# Patient Record
Sex: Female | Born: 1945 | Race: White | Hispanic: No | Marital: Married | Smoking: Never smoker
Health system: Southern US, Community
[De-identification: ages and names within clinical notes are randomized; demographics above are authoritative.]

## PROBLEM LIST (undated history)

## (undated) DIAGNOSIS — R112 Nausea with vomiting, unspecified: Secondary | ICD-10-CM

## (undated) DIAGNOSIS — E119 Type 2 diabetes mellitus without complications: Secondary | ICD-10-CM

## (undated) DIAGNOSIS — T8859XA Other complications of anesthesia, initial encounter: Secondary | ICD-10-CM

## (undated) DIAGNOSIS — L509 Urticaria, unspecified: Secondary | ICD-10-CM

## (undated) DIAGNOSIS — Z9889 Other specified postprocedural states: Secondary | ICD-10-CM

## (undated) DIAGNOSIS — I10 Essential (primary) hypertension: Secondary | ICD-10-CM

## (undated) DIAGNOSIS — T4145XA Adverse effect of unspecified anesthetic, initial encounter: Secondary | ICD-10-CM

## (undated) DIAGNOSIS — K8020 Calculus of gallbladder without cholecystitis without obstruction: Secondary | ICD-10-CM

## (undated) DIAGNOSIS — K219 Gastro-esophageal reflux disease without esophagitis: Secondary | ICD-10-CM

## (undated) HISTORY — PX: OTHER SURGICAL HISTORY: SHX169

## (undated) HISTORY — DX: Urticaria, unspecified: L50.9

## (undated) HISTORY — PX: ESOPHAGOGASTRODUODENOSCOPY ENDOSCOPY: SHX5814

## (undated) HISTORY — PX: ABDOMINAL HYSTERECTOMY: SHX81

---

## 2004-03-29 ENCOUNTER — Encounter: Admission: RE | Admit: 2004-03-29 | Discharge: 2004-03-29 | Payer: Self-pay | Admitting: Family Medicine

## 2004-04-24 ENCOUNTER — Ambulatory Visit (HOSPITAL_COMMUNITY): Admission: RE | Admit: 2004-04-24 | Discharge: 2004-04-24 | Payer: Self-pay | Admitting: Gastroenterology

## 2005-04-12 ENCOUNTER — Encounter: Admission: RE | Admit: 2005-04-12 | Discharge: 2005-04-12 | Payer: Self-pay | Admitting: Family Medicine

## 2006-04-24 ENCOUNTER — Other Ambulatory Visit: Admission: RE | Admit: 2006-04-24 | Discharge: 2006-04-24 | Payer: Self-pay | Admitting: Family Medicine

## 2006-05-08 ENCOUNTER — Encounter: Admission: RE | Admit: 2006-05-08 | Discharge: 2006-05-08 | Payer: Self-pay | Admitting: Family Medicine

## 2007-05-13 ENCOUNTER — Encounter: Admission: RE | Admit: 2007-05-13 | Discharge: 2007-05-13 | Payer: Self-pay | Admitting: Family Medicine

## 2008-05-13 ENCOUNTER — Encounter: Admission: RE | Admit: 2008-05-13 | Discharge: 2008-05-13 | Payer: Self-pay | Admitting: Family Medicine

## 2009-05-15 ENCOUNTER — Encounter: Admission: RE | Admit: 2009-05-15 | Discharge: 2009-05-15 | Payer: Self-pay | Admitting: Family Medicine

## 2010-04-21 ENCOUNTER — Other Ambulatory Visit: Payer: Self-pay | Admitting: Family Medicine

## 2010-04-21 DIAGNOSIS — Z1239 Encounter for other screening for malignant neoplasm of breast: Secondary | ICD-10-CM

## 2010-05-16 ENCOUNTER — Ambulatory Visit
Admission: RE | Admit: 2010-05-16 | Discharge: 2010-05-16 | Disposition: A | Discharge status: Home / Self Care | Payer: 59 | Source: Ambulatory Visit | Attending: Family Medicine | Admitting: Family Medicine

## 2010-05-16 DIAGNOSIS — Z1239 Encounter for other screening for malignant neoplasm of breast: Secondary | ICD-10-CM

## 2010-08-17 NOTE — Op Note (Signed)
NAME:  Margaret, Bond NO.:  [id number]   MEDICAL RECORD NO.:  [id number]          PATIENT TYPE:  AMB   LOCATION:  ENDO                         FACILITY:  [facility]   PHYSICIAN:  Graylin Shiver, M.D.   DATE OF BIRTH:  [birth year]   DATE OF PROCEDURE:  04/24/2004  DATE OF DISCHARGE:                                 OPERATIVE REPORT   INDICATIONS FOR PROCEDURE:  Family history of colon cancer.   Informed consent was obtained after explanation of the risks of bleeding,  infection and perforation.   PREMEDICATION:  Fentanyl 80 mcg IV, Versed 8 mg IV.   PROCEDURE:  With the patient in the left lateral decubitus position, a  rectal exam was performed. No masses were felt. The Olympus colonoscope was  inserted into the rectum and advanced around the colon to the cecum. Cecal  landmarks were identified. The cecum and ascending colon were normal. The  transverse colon normal. The descending colon, sigmoid and rectum were  normal. She tolerated the procedure well without complications.   IMPRESSION:  Normal colonoscopy to the cecum.   I would recommend a follow-up colonoscopy again in five years.      SFG/MEDQ  D:  04/24/2004  T:  04/24/2004  Job:  [id number]   cc:   Santiago Glad, P.A.  [place]

## 2011-04-29 ENCOUNTER — Other Ambulatory Visit: Payer: Self-pay | Admitting: Family Medicine

## 2011-04-29 DIAGNOSIS — Z1231 Encounter for screening mammogram for malignant neoplasm of breast: Secondary | ICD-10-CM

## 2011-05-21 ENCOUNTER — Ambulatory Visit
Admission: RE | Admit: 2011-05-21 | Discharge: 2011-05-21 | Disposition: A | Discharge status: Home / Self Care | Payer: 59 | Source: Ambulatory Visit | Attending: Family Medicine | Admitting: Family Medicine

## 2011-05-21 DIAGNOSIS — Z1231 Encounter for screening mammogram for malignant neoplasm of breast: Secondary | ICD-10-CM

## 2011-05-27 ENCOUNTER — Other Ambulatory Visit: Payer: Self-pay | Admitting: Family Medicine

## 2011-05-27 DIAGNOSIS — R928 Other abnormal and inconclusive findings on diagnostic imaging of breast: Secondary | ICD-10-CM

## 2011-05-29 ENCOUNTER — Ambulatory Visit
Admission: RE | Admit: 2011-05-29 | Discharge: 2011-05-29 | Disposition: A | Discharge status: Home / Self Care | Payer: 59 | Source: Ambulatory Visit | Attending: Family Medicine | Admitting: Family Medicine

## 2011-05-29 DIAGNOSIS — R928 Other abnormal and inconclusive findings on diagnostic imaging of breast: Secondary | ICD-10-CM

## 2012-04-21 ENCOUNTER — Other Ambulatory Visit: Payer: Self-pay | Admitting: Family Medicine

## 2012-04-21 DIAGNOSIS — Z1231 Encounter for screening mammogram for malignant neoplasm of breast: Secondary | ICD-10-CM

## 2012-06-12 ENCOUNTER — Ambulatory Visit
Admission: RE | Admit: 2012-06-12 | Discharge: 2012-06-12 | Disposition: A | Discharge status: Home / Self Care | Payer: Medicare Other | Source: Ambulatory Visit | Attending: Family Medicine | Admitting: Family Medicine

## 2012-06-12 DIAGNOSIS — Z1231 Encounter for screening mammogram for malignant neoplasm of breast: Secondary | ICD-10-CM

## 2012-12-01 IMAGING — US US BREAST RIGHT
1 series · 9 of 9 positions shown · non-contrast
Comparison: Prior studies

CLINICAL DATA: Recall from screening mammography.

DIGITAL DIAGNOSTIC RIGHT BREAST MAMMOGRAM  AND RIGHT BREAST
ULTRASOUND:

[Series 1: us breast right · 9 of 9 slices shown]
[im 1/9]
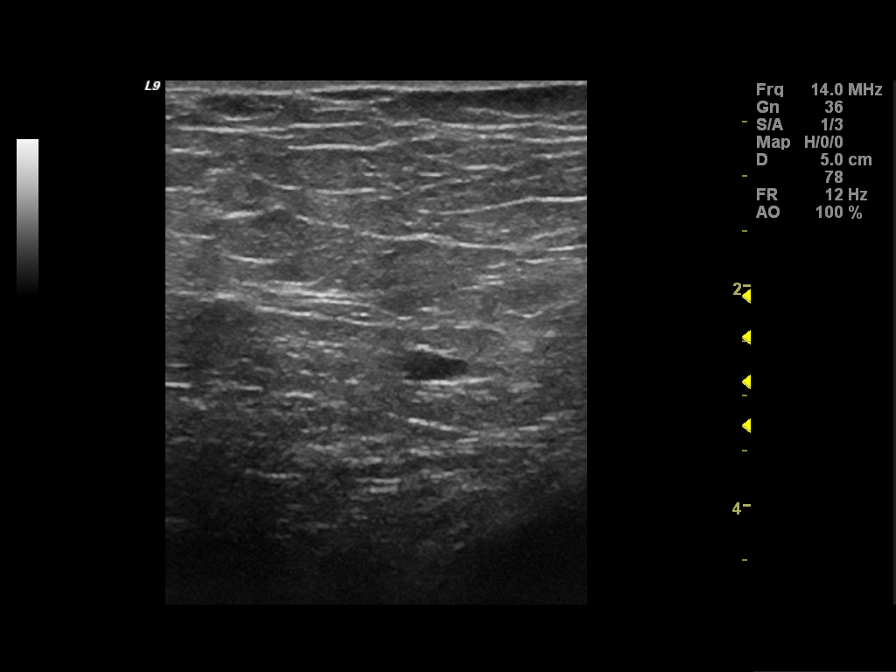
[im 2/9]
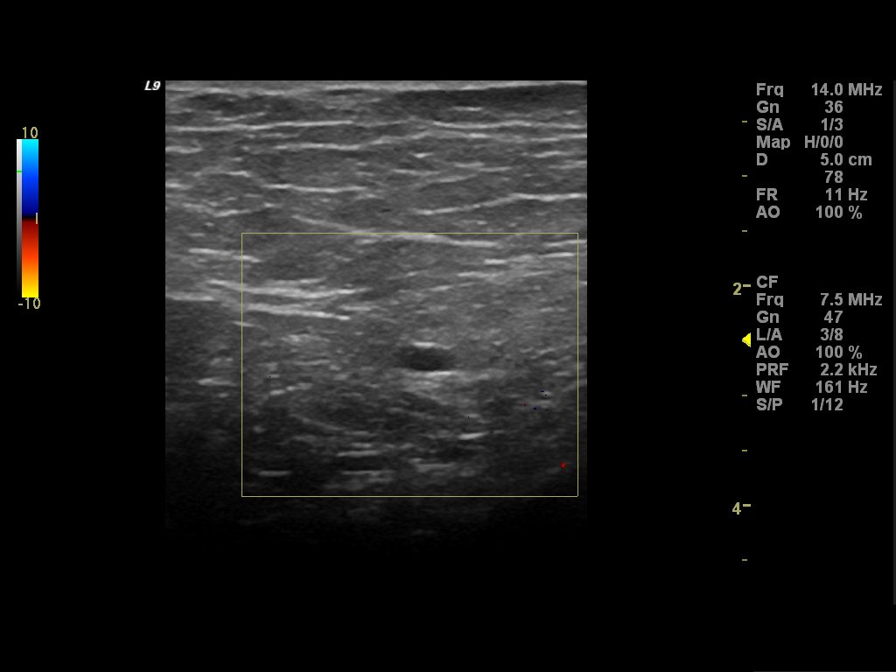
[im 3/9]
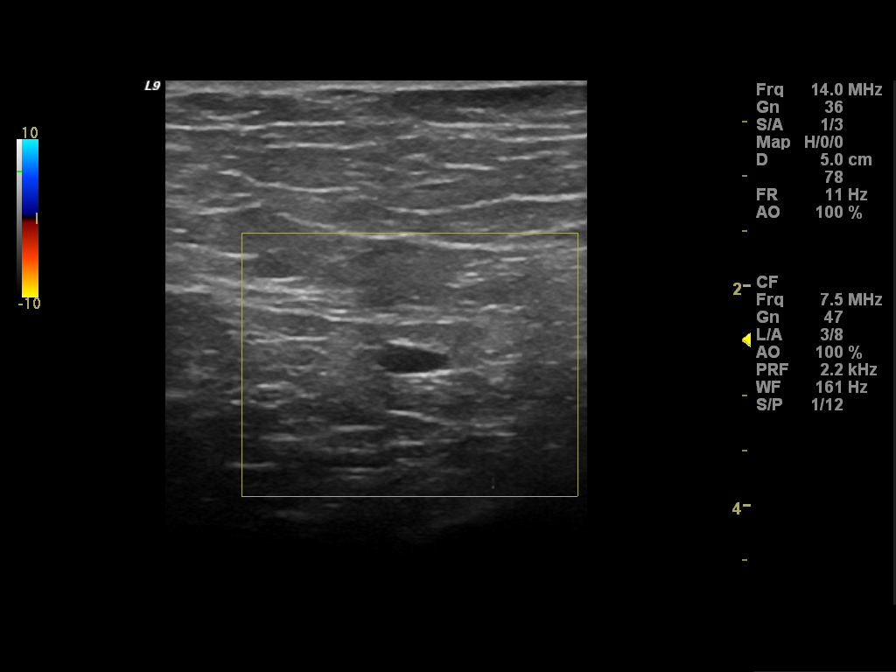
[im 4/9]
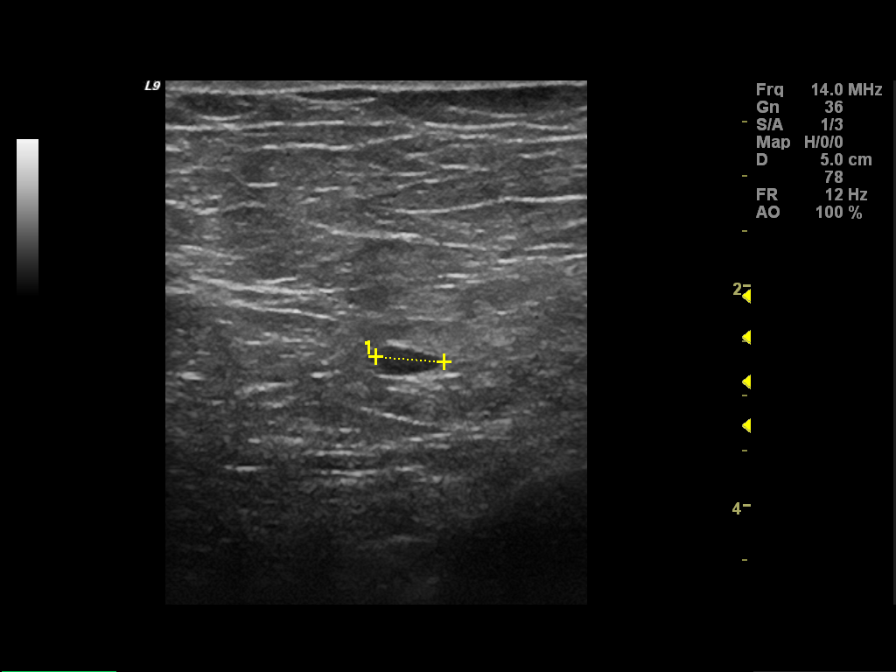
[im 5/9]
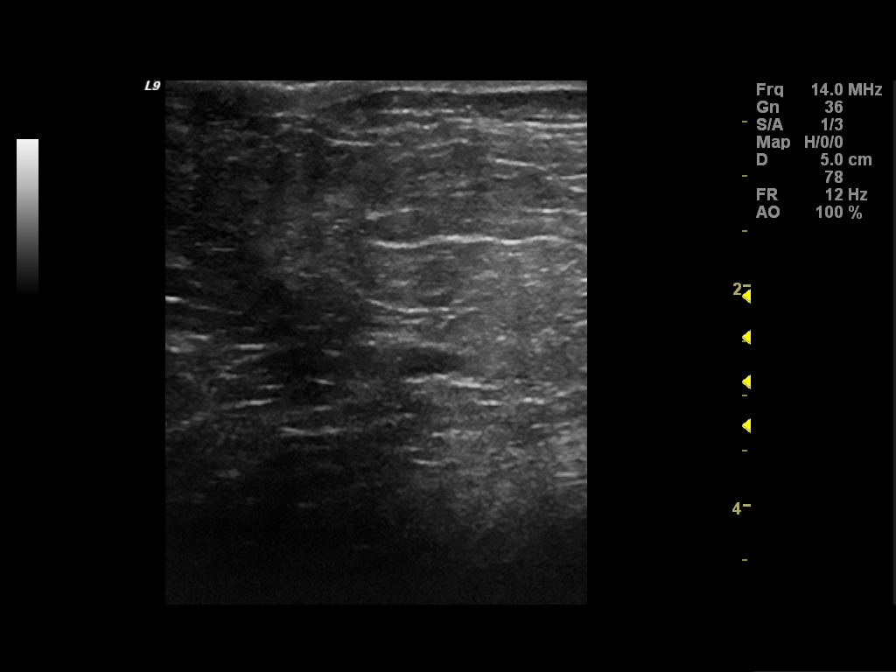
[im 6/9]
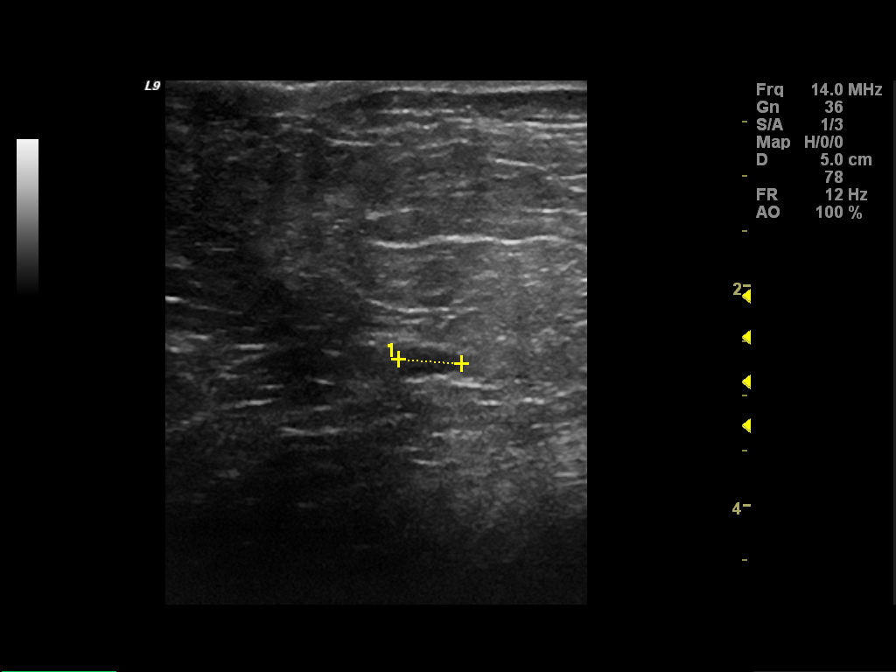
[im 7/9]
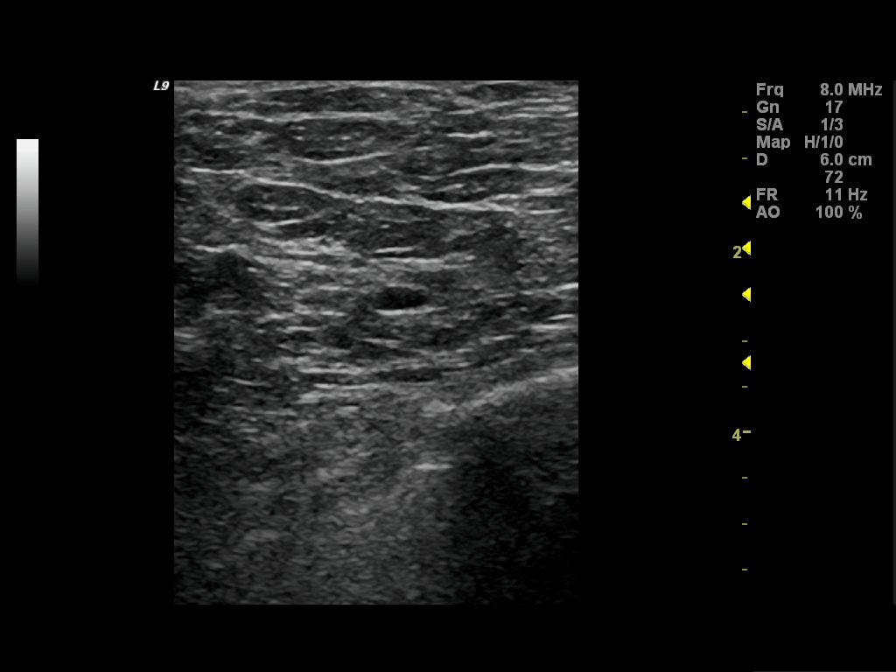
[im 8/9]
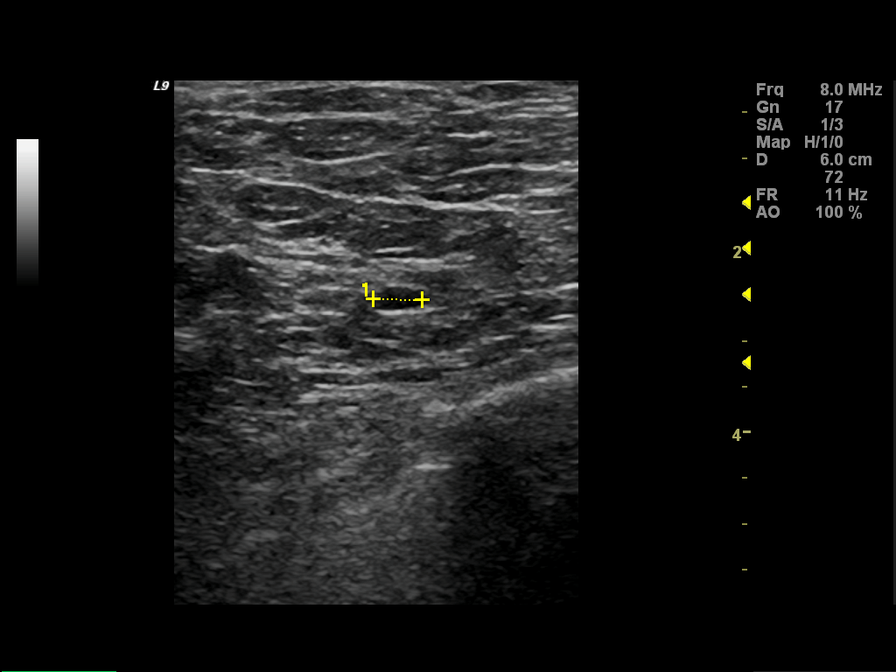
[im 9/9]
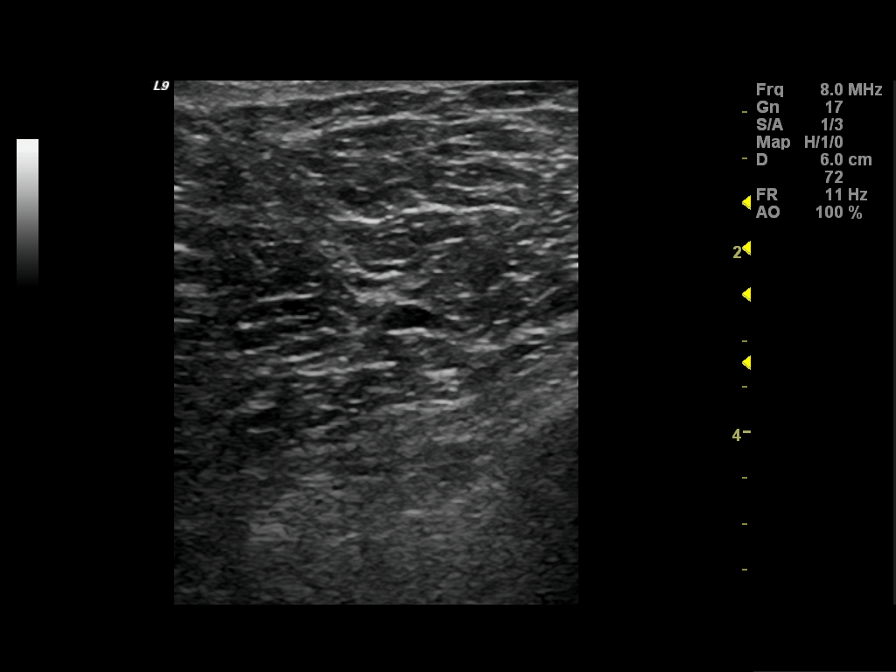

[9 of 9 positions shown; findings below may reference images not displayed]

FINDINGS: Spot compression views of the right breast demonstrate
an oval, circumscribed mass to be located within the inferior
portion of the right breast at the 6 o'clock position.

On physical exam, there is no discrete palpable abnormality within
the right breast.

Ultrasound is performed, showing a simple cyst located within the
right breast at the 6 o'clock position 1 cm from the nipple
measuring 6 mm in size.  This corresponds to the mammographic
finding.
IMPRESSION: 6 mm simple cyst located within the right breast at the 6 o'clock
position 1 cm from the nipple corresponding to the mammographic
finding.  No findings worrisome for malignancy.  Recommend
screening mammography in 1 year.

BI-RADS CATEGORY 2:  Benign finding(s).

## 2013-05-11 ENCOUNTER — Other Ambulatory Visit: Payer: Self-pay

## 2013-05-11 DIAGNOSIS — Z1231 Encounter for screening mammogram for malignant neoplasm of breast: Secondary | ICD-10-CM

## 2013-06-15 ENCOUNTER — Ambulatory Visit
Admission: RE | Admit: 2013-06-15 | Discharge: 2013-06-15 | Disposition: A | Discharge status: Home / Self Care | Payer: Medicare Other | Source: Ambulatory Visit

## 2013-06-15 DIAGNOSIS — Z1231 Encounter for screening mammogram for malignant neoplasm of breast: Secondary | ICD-10-CM

## 2013-11-16 ENCOUNTER — Ambulatory Visit: Payer: Medicare Other | Admitting: Physical Therapy

## 2014-05-17 ENCOUNTER — Other Ambulatory Visit: Payer: Self-pay

## 2014-05-17 DIAGNOSIS — Z1231 Encounter for screening mammogram for malignant neoplasm of breast: Secondary | ICD-10-CM

## 2014-06-17 ENCOUNTER — Ambulatory Visit
Admission: RE | Admit: 2014-06-17 | Discharge: 2014-06-17 | Disposition: A | Discharge status: Home / Self Care | Payer: Medicare Other | Source: Ambulatory Visit

## 2014-06-17 DIAGNOSIS — Z1231 Encounter for screening mammogram for malignant neoplasm of breast: Secondary | ICD-10-CM

## 2015-01-24 ENCOUNTER — Other Ambulatory Visit: Payer: Self-pay | Admitting: Surgery

## 2015-01-24 NOTE — H&P (Signed)
Margaret Bond 01/24/2015 1:46 PM Location: [facility] Patient #: [id number] DOB: 10-05-1945 Married / Language: [name] / Race: White Female  History of Present Illness [name]; 01/24/2015 2:37 PM) Patient words: EVAL. GB.  The patient is a 69 year old female who presents for evaluation of gall stones. Patient sent by primary care physician [name]. Concern for symptomatic gallstones.  Pleasant active woman. Walks a half hour a day. Has had intermittent attacks of RIGHT upper quadrant pain. At least 6. Most in the past year. He wondered if it was heartburn or reflux and used to take Nexium forearm. He was in the RIGHT side. Often triggered at night after her larger meal. [name] foods were the worst culprit. Some nausea but no vomiting. Some bloating. No sick contacts or travel history. Not associated with physical activity. No history of hepatitis. No liver problems. Rarely drinks alcohol. No irritable bowel. No Crohn's. No inflammatory bowel disease. No ulcerative colitis. No hematochezia or melena. No wheat, gluten nor dairy allergies. Had colonoscopy screening that showed diverticulosis only in May of this year. [name] with [facility] gastroenterology. She had a abdominal hysterectomy in 2000 when she [facility]. No other abdominal surgeries. Moves her bowels about every one-2 days.  Because of the recurrent attacks refractory to an antacid medication, she discussed with her primary care physician. Ultrasound ordered showing a large gallstone. Surgical consultation requested.                  Result Narrative TECHNIQUE: Realtime multiplanar grayscale and color Doppler ultrasound of the right upper quadrant was performed.  COMPARISON: None.   INDICATION: R10.11 RUQ pain  FINDINGS:  Pancreas: No focal abnormalities are identified. Visualization is limited.  Vasculature: No abdominal aortic  aneurysm. Visualized IVC is patent. Portal vein is patent with normal flow direction.  Hepatobiliary: No focal liver abnormalities are identified. There is a 2.3 cm stone in the gallbladder neck. No gallbladder wall thickening or sonographic Murphy sign. CBD size is within normal limits. No intrahepatic biliary ductal dilatation.  Right Kidney: Right kidney is of normal size. No hydronephrosis. No suspicious masses. Normal renal echotexture. 1.4 cm right renal cyst.  Additional/incidental: N./A.   IMPRESSION: 1. 2.3 cm stone in region of gallbladder neck. 2. Small right renal cyst.  Electronically Signed by [name]    Other Problems [name], CMA; 01/24/2015 1:47 PM) Diabetes Mellitus Gastroesophageal Reflux Disease High blood pressure Kidney Stone  Past Surgical History [name], CMA; 01/24/2015 1:47 PM) Colon Polyp Removal - Colonoscopy Hysterectomy (not due to cancer) - Complete  Diagnostic Studies History ([name], CMA; 01/24/2015 1:47 PM) Colonoscopy within last year Mammogram within last year Pap Smear 1-5 years ago  Allergies [name], CMA; 01/24/2015 1:50 PM) Sulfabenzamide *CHEMICALS* Decongest *COUGH/COLD/ALLERGY* Codeine Sulfate *ANALGESICS - OPIOID* Dizziness.  Medication History [name], CMA; 01/24/2015 1:52 PM) Alendronate Sodium (70MG  Tablet, Oral) Active. Fenofibrate (145MG  Tablet, Oral) Active. Benicar HCT (20-12.5MG  Tablet, Oral) Active. Crestor (5MG  Tablet, Oral) Active. Januvia (100MG  Tablet, Oral) Active. MetFORMIN HCl (500MG  Tablet, Oral) Active. Calcium (500MG  Tablet, Oral) Active. Vitamin D (2000UNIT Capsule, Oral) Active. Baby Aspirin (81MG  Tablet Chewable, Oral) Active. Medications Reconciled  Social History [name], CMA; 01/24/2015 1:47 PM) Alcohol use Occasional alcohol use. No drug use Tobacco use Former smoker.  Family History [name], [place]; 01/24/2015 1:47 PM) Cerebrovascular Accident Father. Colon Cancer Mother. Colon Polyps Father. Diabetes Mellitus Father. Hypertension Father, Mother.  Pregnancy /  Birth History [name], CMA; 01/24/2015 1:47 PM) Age at menarche 57 years. Age of menopause 37-55 Gravida 0     Review of Systems [name] CMA; 01/24/2015 1:47 PM) General Not Present- Appetite Loss, Chills, Fatigue, Fever, Night Sweats, Weight Gain and Weight Loss. Skin Not Present- Change in Wart/Mole, Dryness, Hives, Jaundice, New Lesions, Non-Healing Wounds, Rash and Ulcer. HEENT Present- Seasonal Allergies. Not Present- Earache, Hearing Loss, Hoarseness, Nose Bleed, Oral Ulcers, Ringing in the Ears, Sinus Pain, Sore Throat, Visual Disturbances, Wears glasses/contact lenses and Yellow Eyes. Respiratory Not Present- Bloody sputum, Chronic Cough, Difficulty Breathing, Snoring and Wheezing. Breast Not Present- Breast Mass, Breast Pain, Nipple Discharge and Skin Changes. Cardiovascular Not Present- Chest Pain, Difficulty Breathing Lying Down, Leg Cramps, Palpitations, Rapid Heart Rate, Shortness of Breath and Swelling of Extremities. Gastrointestinal Present- Indigestion. Not Present- Abdominal Pain, Bloating, Bloody Stool, Change in Bowel Habits, Chronic diarrhea, Constipation, Difficulty Swallowing, Excessive gas, Gets full quickly at meals, Hemorrhoids, Nausea, Rectal Pain and Vomiting. Female Genitourinary Not Present- Frequency, Nocturia, Painful Urination, Pelvic Pain and Urgency. Musculoskeletal Not Present- Back Pain, Joint Pain, Joint Stiffness, Muscle Pain, Muscle Weakness and Swelling of Extremities. Neurological Not Present- Decreased Memory, Fainting, Headaches, Numbness, Seizures, Tingling, Tremor, Trouble walking and Weakness. Psychiatric Not Present- Anxiety, Bipolar, Change in Sleep Pattern, Depression, Fearful and Frequent crying. Endocrine Present- Hot flashes. Not  Present- Cold Intolerance, Excessive Hunger, Hair Changes, Heat Intolerance and New Diabetes. Hematology Not Present- Easy Bruising, Excessive bleeding, Gland problems, HIV and Persistent Infections.  Vitals ([name] CMA; 01/24/2015 1:49 PM) 01/24/2015 1:49 PM Weight: 143 lb Height: 62in Body Surface Area: 1.66 m Body Mass Index: 26.15 kg/m  Pulse: 86 (Regular)  BP: 122/62 (Sitting, Left Arm, Standard)      Physical Exam [name]; 01/24/2015 2:34 PM)  General Mental Status-Alert. General Appearance-Not in acute distress, Not Sickly. Orientation-Oriented X3. Hydration-Well hydrated. Voice-Normal. Note: Pleasant. Half. Apple body habitus  Integumentary Global Assessment Upon inspection and palpation of skin surfaces of the - Axillae: non-tender, no inflammation or ulceration, no drainage. and Distribution of scalp and body hair is normal. General Characteristics Temperature - normal warmth is noted.  Head and Neck Head-normocephalic, atraumatic with no lesions or palpable masses. Face Global Assessment - atraumatic, no absence of expression. Neck Global Assessment - no abnormal movements, no bruit auscultated on the right, no bruit auscultated on the left, no decreased range of motion, non-tender. Trachea-midline. Thyroid Gland Characteristics - non-tender.  Eye Eyeball - Left-Extraocular movements intact, No Nystagmus. Eyeball - Right-Extraocular movements intact, No Nystagmus. Cornea - Left-No Hazy. Cornea - Right-No Hazy. Sclera/Conjunctiva - Left-No scleral icterus, No Discharge. Sclera/Conjunctiva - Right-No scleral icterus, No Discharge. Pupil - Left-Direct reaction to light normal. Pupil - Right-Direct reaction to light normal.  ENMT Ears Pinna - Left - no drainage observed, no generalized tenderness observed. Right - no drainage observed, no generalized tenderness observed. Nose and  Sinuses External Inspection of the Nose - no destructive lesion observed. Inspection of the nares - Left - quiet respiration. Right - quiet respiration. Mouth and Throat Lips - Upper Lip - no fissures observed, no pallor noted. Lower Lip - no fissures observed, no pallor noted. Nasopharynx - no discharge present. Oral Cavity/Oropharynx - Tongue - no dryness observed. Oral Mucosa - no cyanosis observed. Hypopharynx - no evidence of airway distress observed.  Chest and Lung Exam Inspection Movements - Normal and Symmetrical. Accessory muscles - No use of accessory muscles in breathing. Palpation Palpation of  the chest reveals - Non-tender. Auscultation Breath sounds - Normal and Clear.  Cardiovascular Auscultation Rhythm - Regular. Murmurs & Other Heart Sounds - Auscultation of the heart reveals - No Murmurs and No Systolic Clicks.  Abdomen Inspection Inspection of the abdomen reveals - No Visible peristalsis and No Abnormal pulsations. Umbilicus - No Bleeding, No Urine drainage. Palpation/Percussion Palpation and Percussion of the abdomen reveal - Soft, Non Tender, No Rebound tenderness, No Rigidity (guarding) and No Cutaneous hyperesthesia. Note: Mild discomfort in RIGHT upper quadrant but no Murphy sign. Rest of abdomen soft nontender nondistended. Somewhat overweight. No diastases. No umbilical hernia.  Female Genitourinary Sexual Maturity Tanner 5 - Adult hair pattern. Note: No vaginal bleeding nor discharge  Peripheral Vascular Upper Extremity Inspection - Left - No Cyanotic nailbeds, Not Ischemic. Right - No Cyanotic nailbeds, Not Ischemic.  Neurologic Neurologic evaluation reveals -normal attention span and ability to concentrate, able to name objects and repeat phrases. Appropriate fund of knowledge , normal sensation and normal coordination. Mental Status Affect - not angry, not paranoid. Cranial Nerves-Normal Bilaterally. Gait-Normal.  Neuropsychiatric Mental  status exam performed with findings of-able to articulate well with normal speech/language, rate, volume and coherence, thought content normal with ability to perform basic computations and apply abstract reasoning and no evidence of hallucinations, delusions, obsessions or homicidal/suicidal ideation.  Musculoskeletal Global Assessment Spine, Ribs and Pelvis - no instability, subluxation or laxity. Right Upper Extremity - no instability, subluxation or laxity.  Lymphatic Head & Neck  General Head & Neck Lymphatics: Bilateral - Description - No Localized lymphadenopathy. Axillary  General Axillary Region: Bilateral - Description - No Localized lymphadenopathy. Femoral & Inguinal  Generalized Femoral & Inguinal Lymphatics: Left - Description - No Localized lymphadenopathy. Right - Description - No Localized lymphadenopathy.    Assessment & Plan [name]; 01/24/2015 2:35 PM)  CHRONIC CHOLECYSTITIS WITH CALCULUS (K80.10) Impression: Classic story of biliary colic with large gallstone. Rest of the differential diagnosis seems unlikely.  I think she would benefit from cholecystectomy. Reasonable single site approach since only Pfannenstiel incision for hysterectomy. She is hoping to get this done and be able to recover and time for her vacation in early January.  Current Plans You are being scheduled for surgery - Our schedulers will call you.  You should hear from our office's scheduling department within 5 working days about the location, date, and time of surgery. We try to make accommodations for patient's preferences in scheduling surgery, but sometimes the OR schedule or the surgeon's schedule prevents [place] from making those accommodations.  If you have not heard from our office [phone]) in 5 working days, call the office and ask for your surgeon's nurse.  If you have other questions about your diagnosis, plan, or surgery, call the office and ask for your surgeon's  nurse.  Pt Education - Pamphlet Given - Laparoscopic Gallbladder Surgery: discussed with patient and provided information. Written instructions provided The anatomy & physiology of hepatobiliary & pancreatic function was discussed. The pathophysiology of gallbladder dysfunction was discussed. Natural history risks without surgery was discussed. I feel the risks of no intervention will lead to serious problems that outweigh the operative risks; therefore, I recommended cholecystectomy to remove the pathology. I explained laparoscopic techniques with possible need for an open approach. Probable cholangiogram to evaluate the bilary tract was explained as well.  Risks such as bleeding, infection, abscess, leak, injury to other organs, need for further treatment, heart attack, death, and other risks were discussed. I noted  a good likelihood this will help address the problem. Possibility that this will not correct all abdominal symptoms was explained. Goals of post-operative recovery were discussed as well. We will work to minimize complications. An educational handout further explaining the pathology and treatment options was given as well. Questions were answered. The patient expresses understanding & wishes to proceed with surgery.  Pt Education - [facility] Laparosopic Post Op HCI (Linnell Swords) Pt Education - [facility] Good Bowel Health (Anallely Rosell) Pt Education - Laparoscopic Cholecystectomy: gallbladder  Adin Hector, M.D., F.A.C.S. Gastrointestinal and Minimally Invasive Surgery [facility] [address], [place], [place] [address] [phone] Main / Paging

## 2015-01-31 ENCOUNTER — Other Ambulatory Visit (HOSPITAL_COMMUNITY): Payer: Self-pay | Admitting: *Deleted

## 2015-01-31 NOTE — Patient Instructions (Addendum)
Margaret Bond  01/31/2015   Your procedure is scheduled on: 02-02-15  Report to [place]  Entrance take [facility]  elevators to 3rd floor to  [place] at 930 AM.  Call this number if you have problems the morning of surgery [phone]   Remember: ONLY 1 PERSON MAY GO WITH YOU TO SHORT STAY TO GET  READY MORNING OF [place].  Do not eat food or drink liquids :After Midnight.     Take these medicines the morning of surgery with A SIP OF WATER: none DO NOT TAKE ANY DIABETIC MEDICATIONS DAY OF YOUR SURGERY                               You may not have any metal on your body including hair pins and              piercings  Do not wear jewelry, make-up, lotions, powders or perfumes, deodorant             Do not wear nail polish.  Do not shave  48 hours prior to surgery.              Men may shave face and neck.   Do not bring valuables to the hospital. [place].  Contacts, dentures or bridgework may not be worn into surgery.  Leave suitcase in the car. After surgery it may be brought to your room.     Patients discharged the day of surgery will not be allowed to drive home.  Name and phone number of your driver:dan Haliburton spouse cell [phone]  Special Instructions: N/A              Please read over the following fact sheets you were given: _____________________________________________________________________             [facility] - Preparing for Surgery Before surgery, you can play an important role.  Because skin is not sterile, your skin needs to be as free of germs as possible.  You can reduce the number of germs on your skin by washing with CHG (chlorahexidine gluconate) soap before surgery.  CHG is an antiseptic cleaner which kills germs and bonds with the skin to continue killing germs even after washing. Please DO NOT use if you have an allergy to CHG or antibacterial soaps.  If your  skin becomes reddened/irritated stop using the CHG and inform your nurse when you arrive at Short Stay. Do not shave (including legs and underarms) for at least 48 hours prior to the first CHG shower.  You may shave your face/neck. Please follow these instructions carefully:  1.  Shower with CHG Soap the night before surgery and the  morning of Surgery.  2.  If you choose to wash your hair, wash your hair first as usual with your  normal  shampoo.  3.  After you shampoo, rinse your hair and body thoroughly to remove the  shampoo.                           4.  Use CHG as you would any other liquid soap.  You can apply chg directly  to the skin  and wash                       Gently with a scrungie or clean washcloth.  5.  Apply the CHG Soap to your body ONLY FROM THE NECK DOWN.   Do not use on face/ open                           Wound or open sores. Avoid contact with eyes, ears mouth and genitals (private parts).                       Wash face,  Genitals (private parts) with your normal soap.             6.  Wash thoroughly, paying special attention to the area where your surgery  will be performed.  7.  Thoroughly rinse your body with warm water from the neck down.  8.  DO NOT shower/wash with your normal soap after using and rinsing off  the CHG Soap.                9.  Pat yourself dry with a clean towel.            10.  Wear clean pajamas.            11.  Place clean sheets on your bed the night of your first shower and do not  sleep with pets. Day of Surgery : Do not apply any lotions/deodorants the morning of surgery.  Please wear clean clothes to the hospital/surgery center.  FAILURE TO FOLLOW THESE INSTRUCTIONS MAY RESULT IN THE CANCELLATION OF YOUR SURGERY PATIENT SIGNATURE_________________________________  NURSE SIGNATURE__________________________________  ________________________________________________________________________

## 2015-02-01 ENCOUNTER — Encounter (HOSPITAL_COMMUNITY): Payer: Self-pay

## 2015-02-01 ENCOUNTER — Encounter (HOSPITAL_COMMUNITY)
Admission: RE | Admit: 2015-02-01 | Discharge: 2015-02-01 | Disposition: A | Discharge status: Home / Self Care | Payer: Medicare Other | Source: Ambulatory Visit | Attending: Surgery | Admitting: Surgery

## 2015-02-01 DIAGNOSIS — Z7982 Long term (current) use of aspirin: Secondary | ICD-10-CM | POA: Diagnosis not present

## 2015-02-01 DIAGNOSIS — Z79899 Other long term (current) drug therapy: Secondary | ICD-10-CM | POA: Diagnosis not present

## 2015-02-01 DIAGNOSIS — Z7984 Long term (current) use of oral hypoglycemic drugs: Secondary | ICD-10-CM | POA: Diagnosis not present

## 2015-02-01 DIAGNOSIS — Z87891 Personal history of nicotine dependence: Secondary | ICD-10-CM | POA: Diagnosis not present

## 2015-02-01 DIAGNOSIS — N281 Cyst of kidney, acquired: Secondary | ICD-10-CM | POA: Diagnosis not present

## 2015-02-01 DIAGNOSIS — I10 Essential (primary) hypertension: Secondary | ICD-10-CM | POA: Diagnosis not present

## 2015-02-01 DIAGNOSIS — K8020 Calculus of gallbladder without cholecystitis without obstruction: Secondary | ICD-10-CM | POA: Diagnosis present

## 2015-02-01 DIAGNOSIS — E119 Type 2 diabetes mellitus without complications: Secondary | ICD-10-CM | POA: Diagnosis not present

## 2015-02-01 DIAGNOSIS — K219 Gastro-esophageal reflux disease without esophagitis: Secondary | ICD-10-CM | POA: Diagnosis not present

## 2015-02-01 DIAGNOSIS — K8010 Calculus of gallbladder with chronic cholecystitis without obstruction: Secondary | ICD-10-CM | POA: Diagnosis not present

## 2015-02-01 HISTORY — DX: Nausea with vomiting, unspecified: R11.2

## 2015-02-01 HISTORY — DX: Type 2 diabetes mellitus without complications: E11.9

## 2015-02-01 HISTORY — DX: Adverse effect of unspecified anesthetic, initial encounter: T41.45XA

## 2015-02-01 HISTORY — DX: Other specified postprocedural states: Z98.890

## 2015-02-01 HISTORY — DX: Other complications of anesthesia, initial encounter: T88.59XA

## 2015-02-01 HISTORY — DX: Calculus of gallbladder without cholecystitis without obstruction: K80.20

## 2015-02-01 HISTORY — DX: Essential (primary) hypertension: I10

## 2015-02-01 HISTORY — DX: Gastro-esophageal reflux disease without esophagitis: K21.9

## 2015-02-01 LAB — BASIC METABOLIC PANEL
ANION GAP: 4 — AB (ref 5–15)
BUN: 22 mg/dL — AB (ref 6–20)
CALCIUM: 10.3 mg/dL (ref 8.9–10.3)
CO2: 27 mmol/L (ref 22–32)
CREATININE: 1.08 mg/dL — AB (ref 0.44–1.00)
Chloride: 109 mmol/L (ref 101–111)
GFR calc Af Amer: >60 mL/min (ref >60)
GFR, EST NON AFRICAN AMERICAN: 52 mL/min — AB (ref >60)
GLUCOSE: 186 mg/dL — AB (ref 65–99)
Potassium: 4.2 mmol/L (ref 3.5–5.1)
Sodium: 140 mmol/L (ref 135–145)

## 2015-02-01 LAB — CBC
HCT: 38.4 % (ref 36.0–46.0)
HEMOGLOBIN: 11.9 g/dL — AB (ref 12.0–15.0)
MCH: 25.1 pg — AB (ref 26.0–34.0)
MCHC: 31 g/dL (ref 30.0–36.0)
MCV: 80.8 fL (ref 78.0–100.0)
PLATELETS: 416 10*3/uL — AB (ref 150–400)
RBC: 4.75 MIL/uL (ref 3.87–5.11)
RDW: 15.4 % (ref 11.5–15.5)
WBC: 6.2 10*3/uL (ref 4.0–10.5)

## 2015-02-02 ENCOUNTER — Ambulatory Visit (HOSPITAL_COMMUNITY): Payer: Medicare Other

## 2015-02-02 ENCOUNTER — Encounter (HOSPITAL_COMMUNITY): Payer: Self-pay | Admitting: *Deleted

## 2015-02-02 ENCOUNTER — Ambulatory Visit (HOSPITAL_COMMUNITY): Payer: Medicare Other | Admitting: Certified Registered"

## 2015-02-02 ENCOUNTER — Ambulatory Visit (HOSPITAL_COMMUNITY)
Admission: RE | Admit: 2015-02-02 | Discharge: 2015-02-02 | Disposition: A | Discharge status: Home / Self Care | Payer: Medicare Other | Source: Ambulatory Visit | Attending: Surgery | Admitting: Surgery

## 2015-02-02 ENCOUNTER — Encounter (HOSPITAL_COMMUNITY)
Admission: RE | Disposition: A | Discharge status: Home / Self Care | Payer: Self-pay | Source: Ambulatory Visit | Attending: Surgery

## 2015-02-02 DIAGNOSIS — Z87891 Personal history of nicotine dependence: Secondary | ICD-10-CM | POA: Insufficient documentation

## 2015-02-02 DIAGNOSIS — Z79899 Other long term (current) drug therapy: Secondary | ICD-10-CM | POA: Insufficient documentation

## 2015-02-02 DIAGNOSIS — K8010 Calculus of gallbladder with chronic cholecystitis without obstruction: Secondary | ICD-10-CM

## 2015-02-02 DIAGNOSIS — Z7982 Long term (current) use of aspirin: Secondary | ICD-10-CM | POA: Insufficient documentation

## 2015-02-02 DIAGNOSIS — K219 Gastro-esophageal reflux disease without esophagitis: Secondary | ICD-10-CM | POA: Diagnosis present

## 2015-02-02 DIAGNOSIS — E119 Type 2 diabetes mellitus without complications: Secondary | ICD-10-CM | POA: Diagnosis not present

## 2015-02-02 DIAGNOSIS — I10 Essential (primary) hypertension: Secondary | ICD-10-CM | POA: Insufficient documentation

## 2015-02-02 DIAGNOSIS — N281 Cyst of kidney, acquired: Secondary | ICD-10-CM | POA: Insufficient documentation

## 2015-02-02 DIAGNOSIS — Z419 Encounter for procedure for purposes other than remedying health state, unspecified: Secondary | ICD-10-CM

## 2015-02-02 DIAGNOSIS — Z7984 Long term (current) use of oral hypoglycemic drugs: Secondary | ICD-10-CM | POA: Insufficient documentation

## 2015-02-02 HISTORY — PX: LAPAROSCOPIC CHOLECYSTECTOMY SINGLE SITE WITH INTRAOPERATIVE CHOLANGIOGRAM: SHX6538

## 2015-02-02 LAB — GLUCOSE, CAPILLARY
GLUCOSE-CAPILLARY: 133 mg/dL — AB (ref 65–99)
Glucose-Capillary: 139 mg/dL — ABNORMAL HIGH (ref 65–99)

## 2015-02-02 SURGERY — LAPAROSCOPIC CHOLECYSTECTOMY SINGLE SITE WITH INTRAOPERATIVE CHOLANGIOGRAM
Anesthesia: General

## 2015-02-02 MED ORDER — LACTATED RINGERS IV SOLN: INTRAVENOUS | Status: DC

## 2015-02-02 MED ORDER — DIPHENHYDRAMINE HCL 50 MG/ML IJ SOLN
12.5000 mg | INTRAMUSCULAR | Status: DC
  Administered 2015-02-02: 12.5 mg via INTRAVENOUS
  Filled 2015-02-02: qty 1

## 2015-02-02 MED ORDER — STERILE WATER FOR IRRIGATION IR SOLN
Status: DC | PRN
  Administered 2015-02-02: 1000 mL

## 2015-02-02 MED ORDER — LIDOCAINE HCL (CARDIAC) 20 MG/ML IV SOLN
INTRAVENOUS | Status: AC
  Filled 2015-02-02: qty 5

## 2015-02-02 MED ORDER — FENTANYL CITRATE (PF) 100 MCG/2ML IJ SOLN
INTRAMUSCULAR | Status: DC | PRN
  Administered 2015-02-02 (×2): 50 ug via INTRAVENOUS
  Administered 2015-02-02: 100 ug via INTRAVENOUS
  Administered 2015-02-02: 50 ug via INTRAVENOUS

## 2015-02-02 MED ORDER — HYDROMORPHONE HCL 1 MG/ML IJ SOLN
0.2500 mg | INTRAMUSCULAR | Status: DC | PRN
  Administered 2015-02-02: 0.5 mg via INTRAVENOUS

## 2015-02-02 MED ORDER — NEOSTIGMINE METHYLSULFATE 10 MG/10ML IV SOLN
INTRAVENOUS | Status: DC | PRN
  Administered 2015-02-02: 4 mg via INTRAVENOUS

## 2015-02-02 MED ORDER — FENTANYL CITRATE (PF) 250 MCG/5ML IJ SOLN
INTRAMUSCULAR | Status: AC
  Filled 2015-02-02: qty 25

## 2015-02-02 MED ORDER — PROPOFOL 10 MG/ML IV BOLUS
INTRAVENOUS | Status: AC
  Filled 2015-02-02: qty 20

## 2015-02-02 MED ORDER — DEXAMETHASONE SODIUM PHOSPHATE 10 MG/ML IJ SOLN
INTRAMUSCULAR | Status: DC | PRN
  Administered 2015-02-02: 10 mg via INTRAVENOUS

## 2015-02-02 MED ORDER — BUPIVACAINE-EPINEPHRINE 0.25% -1:200000 IJ SOLN
INTRAMUSCULAR | Status: AC
  Filled 2015-02-02: qty 1

## 2015-02-02 MED ORDER — KETOROLAC TROMETHAMINE 30 MG/ML IJ SOLN
INTRAMUSCULAR | Status: AC
  Filled 2015-02-02: qty 1

## 2015-02-02 MED ORDER — ROCURONIUM BROMIDE 100 MG/10ML IV SOLN
INTRAVENOUS | Status: AC
  Filled 2015-02-02: qty 1

## 2015-02-02 MED ORDER — 0.9 % SODIUM CHLORIDE (POUR BTL) OPTIME
TOPICAL | Status: DC | PRN
  Administered 2015-02-02: 1000 mL

## 2015-02-02 MED ORDER — ONDANSETRON HCL 4 MG/2ML IJ SOLN
INTRAMUSCULAR | Status: DC | PRN
  Administered 2015-02-02: 4 mg via INTRAVENOUS

## 2015-02-02 MED ORDER — PROMETHAZINE HCL 12.5 MG PO TABS: 12.5000 mg | ORAL_TABLET | Freq: Four times a day (QID) | ORAL | Status: DC | PRN

## 2015-02-02 MED ORDER — BUPIVACAINE-EPINEPHRINE 0.25% -1:200000 IJ SOLN
INTRAMUSCULAR | Status: DC | PRN
  Administered 2015-02-02: 60 mL

## 2015-02-02 MED ORDER — DEXAMETHASONE SODIUM PHOSPHATE 10 MG/ML IJ SOLN
INTRAMUSCULAR | Status: AC
  Filled 2015-02-02: qty 1

## 2015-02-02 MED ORDER — MIDAZOLAM HCL 5 MG/5ML IJ SOLN
INTRAMUSCULAR | Status: DC | PRN
Start: 2015-02-02 — End: 2015-02-02
  Administered 2015-02-02: 2 mg via INTRAVENOUS

## 2015-02-02 MED ORDER — LACTATED RINGERS IV SOLN
INTRAVENOUS | Status: DC | PRN
  Administered 2015-02-02 (×2): via INTRAVENOUS

## 2015-02-02 MED ORDER — BUPIVACAINE-EPINEPHRINE (PF) 0.25% -1:200000 IJ SOLN
INTRAMUSCULAR | Status: AC
  Filled 2015-02-02: qty 30

## 2015-02-02 MED ORDER — LIDOCAINE HCL (CARDIAC) 20 MG/ML IV SOLN
INTRAVENOUS | Status: DC | PRN
  Administered 2015-02-02: 50 mg via INTRAVENOUS

## 2015-02-02 MED ORDER — KETOROLAC TROMETHAMINE 30 MG/ML IJ SOLN
INTRAMUSCULAR | Status: DC | PRN
  Administered 2015-02-02: 30 mg via INTRAVENOUS

## 2015-02-02 MED ORDER — HYDROMORPHONE HCL 1 MG/ML IJ SOLN
INTRAMUSCULAR | Status: AC
  Filled 2015-02-02: qty 1

## 2015-02-02 MED ORDER — PROPOFOL 10 MG/ML IV BOLUS
INTRAVENOUS | Status: DC | PRN
  Administered 2015-02-02: 180 mg via INTRAVENOUS

## 2015-02-02 MED ORDER — OXYCODONE HCL 5 MG PO TABS: 5.0000 mg | ORAL_TABLET | ORAL | Status: DC | PRN

## 2015-02-02 MED ORDER — ONDANSETRON HCL 4 MG/2ML IJ SOLN
INTRAMUSCULAR | Status: AC
  Filled 2015-02-02: qty 2

## 2015-02-02 MED ORDER — MIDAZOLAM HCL 2 MG/2ML IJ SOLN
INTRAMUSCULAR | Status: AC
  Filled 2015-02-02: qty 4

## 2015-02-02 MED ORDER — IOHEXOL 300 MG/ML  SOLN
INTRAMUSCULAR | Status: DC | PRN
  Administered 2015-02-02: 8 mL

## 2015-02-02 MED ORDER — GLYCOPYRROLATE 0.2 MG/ML IJ SOLN
INTRAMUSCULAR | Status: DC | PRN
Start: 2015-02-02 — End: 2015-02-02
  Administered 2015-02-02: 0.6 mg via INTRAVENOUS

## 2015-02-02 MED ORDER — ROCURONIUM BROMIDE 100 MG/10ML IV SOLN
INTRAVENOUS | Status: DC | PRN
  Administered 2015-02-02: 5 mg via INTRAVENOUS
  Administered 2015-02-02: 35 mg via INTRAVENOUS

## 2015-02-02 MED ORDER — SUCCINYLCHOLINE CHLORIDE 20 MG/ML IJ SOLN
INTRAMUSCULAR | Status: DC | PRN
  Administered 2015-02-02: 100 mg via INTRAVENOUS

## 2015-02-02 SURGICAL SUPPLY — 38 items
APPLIER CLIP 5 13 M/L LIGAMAX5 (MISCELLANEOUS) ×2
CABLE HIGH FREQUENCY MONO STRZ (ELECTRODE) ×2 IMPLANT
CHLORAPREP W/TINT 26ML (MISCELLANEOUS) ×2 IMPLANT
CLIP APPLIE 5 13 M/L LIGAMAX5 (MISCELLANEOUS) ×1 IMPLANT
COVER MAYO STAND STRL (DRAPES) ×2 IMPLANT
COVER SURGICAL LIGHT HANDLE (MISCELLANEOUS) ×2 IMPLANT
DECANTER SPIKE VIAL GLASS SM (MISCELLANEOUS) ×2 IMPLANT
DRAIN CHANNEL 19F RND (DRAIN) IMPLANT
DRAPE C-ARM 42X120 X-RAY (DRAPES) ×2 IMPLANT
DRAPE LAPAROSCOPIC ABDOMINAL (DRAPES) ×2 IMPLANT
DRAPE WARM FLUID 44X44 (DRAPE) ×2 IMPLANT
DRSG TEGADERM 4X4.75 (GAUZE/BANDAGES/DRESSINGS) ×2 IMPLANT
ELECT REM PT RETURN 9FT ADLT (ELECTROSURGICAL) ×2
ELECTRODE REM PT RTRN 9FT ADLT (ELECTROSURGICAL) ×1 IMPLANT
ENDOLOOP SUT PDS II  0 18 (SUTURE)
ENDOLOOP SUT PDS II 0 18 (SUTURE) IMPLANT
EVACUATOR SILICONE 100CC (DRAIN) IMPLANT
GAUZE SPONGE 2X2 8PLY STRL LF (GAUZE/BANDAGES/DRESSINGS) ×1 IMPLANT
GLOVE ECLIPSE 8.0 STRL XLNG CF (GLOVE) ×2 IMPLANT
GLOVE INDICATOR 8.0 STRL GRN (GLOVE) ×2 IMPLANT
GOWN STRL REUS W/TWL XL LVL3 (GOWN DISPOSABLE) ×4 IMPLANT
KIT BASIN OR (CUSTOM PROCEDURE TRAY) ×2 IMPLANT
PAD POSITIONING PINK XL (MISCELLANEOUS) ×2 IMPLANT
PEN SKIN MARKING BROAD (MISCELLANEOUS) ×2 IMPLANT
POUCH SPECIMEN RETRIEVAL 10MM (ENDOMECHANICALS) IMPLANT
SCISSORS LAP 5X35 DISP (ENDOMECHANICALS) IMPLANT
SET CHOLANGIOGRAPH MIX (MISCELLANEOUS) ×2 IMPLANT
SET IRRIG TUBING LAPAROSCOPIC (IRRIGATION / IRRIGATOR) ×2 IMPLANT
SHEARS HARMONIC ACE PLUS 36CM (ENDOMECHANICALS) ×2 IMPLANT
SPONGE GAUZE 2X2 STER 10/PKG (GAUZE/BANDAGES/DRESSINGS) ×1
SUT MNCRL AB 4-0 PS2 18 (SUTURE) ×2 IMPLANT
SUT PDS AB 1 CT1 27 (SUTURE) ×4 IMPLANT
SYR 20CC LL (SYRINGE) ×2 IMPLANT
TOWEL OR 17X26 10 PK STRL BLUE (TOWEL DISPOSABLE) ×2 IMPLANT
TOWEL OR NON WOVEN STRL DISP B (DISPOSABLE) IMPLANT
TRAY LAPAROSCOPIC (CUSTOM PROCEDURE TRAY) ×2 IMPLANT
TROCAR BLADELESS OPT 5 100 (ENDOMECHANICALS) ×2 IMPLANT
TROCAR BLADELESS OPT 5 150 (ENDOMECHANICALS) ×2 IMPLANT

## 2015-02-02 NOTE — Op Note (Signed)
02/02/2015  12:24 PM  PATIENT:  Margaret Bond  69 y.o. female  Patient Care Team: Leighton Ruff, MD as PCP - General (Family Medicine) Wonda Horner, MD as Consulting Physician (Gastroenterology) Michael Boston, MD as Consulting Physician (General Surgery)  PRE-OPERATIVE DIAGNOSIS:  Biliary Colic  POST-OPERATIVE DIAGNOSIS:  Biliary Colic  PROCEDURE:  Procedure(s): LAPAROSCOPIC CHOLECYSTECTOMY SINGLE SITE WITH INTRAOPERATIVE CHOLANGIOGRAM  SURGEON:  Surgeon(s): Michael Boston, MD  ASSISTANT: RNFA  ANESTHESIA:   local and general  EBL:  Total I/O In: 1000 [I.V.:1000] Out: -   Delay start of Pharmacological VTE agent (>24hrs) due to surgical blood loss or risk of bleeding:  no  DRAINS: none   SPECIMEN:  Source of Specimen:  Gallbladder   DISPOSITION OF SPECIMEN:  PATHOLOGY  COUNTS:  YES  PLAN OF CARE: Discharge to home after PACU  PATIENT DISPOSITION:  PACU - hemodynamically stable.  INDICATION: Patient with biliary colic in large gallstone.  Rest of the differential diagnosis unlikely.  I offered cholecystectomy:  The anatomy & physiology of hepatobiliary & pancreatic function was discussed.  The pathophysiology of gallbladder dysfunction was discussed.  Natural history risks without surgery was discussed.   I feel the risks of no intervention will lead to serious problems that outweigh the operative risks; therefore, I recommended cholecystectomy to remove the pathology.  I explained laparoscopic techniques with possible need for an open approach.  Probable cholangiogram to evaluate the bilary tract was explained as well.    Risks such as bleeding, infection, abscess, leak, injury to other organs, need for further treatment, heart attack, death, and other risks were discussed.  I noted a good likelihood this will help address the problem.  Possibility that this will not correct all abdominal symptoms was explained.  Goals of post-operative recovery were discussed as well.   We will work to minimize complications.  An educational handout further explaining the pathology and treatment options was given as well.  Questions were answered.  The patient expresses understanding & wishes to proceed with surgery.   OR FINDINGS: Gallbladder wall thickening suspicious for chronic cholecystitis.  Some adhesions of duodenal bulb and mesocolon as well.  Perhaps mild fatty change in liver but no significant steatohepatitis nor cirrhosis.  DESCRIPTION:   The patient was identified & brought in the operating room. The patient was positioned supine with arms tucked. SCDs were active during the entire case. The patient underwent general anesthesia without any difficulty.  The abdomen was prepped and draped in a sterile fashion. A Surgical Timeout confirmed our plan.  I made a transverse curvilinear incision through the superior umbilical fold.  I placed a 24mm long port through the supraumbilical fascia using a modified Hassan cutdown technique. I began carbon dioxide insufflation. Camera inspection revealed no injury. There were no adhesions to the anterior abdominal wall supraumbilically.  I proceeded to continue with single site technique. I placed a #5 port in left upper aspect of the wound. I placed a 5 mm atraumatic grasper in the right inferior aspect of the wound.  I turned attention to the right upper quadrant.  The gallbladder fundus was elevated cephalad.  I freed the duodenal bulb and mesocolon adhesions to the gallbladder to better expose the infundibulum and underside of the liver.  I freed the peritoneal coverings between the gallbladder and the liver on the posteriolateral and anteriomedial walls. I alternated between Harmonic & blunt Maryland dissection to help get a good critical view of the cystic artery and cystic duct.  I did further dissection to free a few centimeters of the  gallbladder off the liver bed to get a good critical view of the infundibulum and cystic duct. I  mobilized the cystic artery; and, after getting a good 360 view, ligated the cystic artery using the Harmonic ultrasonic dissection. I skeletonized the cystic duct.  I placed a clip on the infundibulum. I did a partial cystic duct-otomy and ensured patency. I placed a 5 [place] cholangiocatheter through a puncture site at the right subcostal ridge of the abdominal wall and directed it into the cystic duct.  There was some delay in fluoroscopy.  Therefore I did a dome down on the gallbladder to free its adhesions to the liver.  We ran a cholangiogram with dilute radio-opaque contrast and continuous fluoroscopy.  Contrast flowed from a side branch consistent with cystic duct cannulization. Contrast flowed up the common hepatic duct into the right and left intrahepatic chains out to secondary radicals. Contrast flowed down the common bile duct easily across the normal ampulla into the duodenum.  This was consistent with a normal cholangiogram.  I removed the cholangiocatheter. I placed clips on the cystic duct x4.   I completed cystic duct transection.I ensured hemostasis on the gallbladder fossa of the liver and elsewhere. I inspected the rest of the abdomen & detected no injury nor bleeding elsewhere.  I removed the gallbladder out the supraumbilical fascia.  I did have to open up the wound 2.5 cm to get the gallbladder with its large stone out.  I closed the fascia transversely using #1 PDS interrupted stitches transversely  I closed the skiin using 4-0 monocryl stitch.  Sterile dressing was applied. The patient was extubated & arrived in the PACU in stable condition..  I had discussed postoperative care with the patient in the holding area.  I am about to locate the patient's family and discuss operative findings and postoperative goals / instructions.  Instructions are written in the chart as well.  Adin Hector, M.D., F.A.C.S. Gastrointestinal and Minimally Invasive Surgery [facility] [address] [place], [place] [address] [phone] Main / Paging

## 2015-02-02 NOTE — Transfer of Care (Signed)
Immediate Anesthesia Transfer of Care Note  Patient: Margaret Bond  Procedure(s) Performed: Procedure(s): LAPAROSCOPIC CHOLECYSTECTOMY SINGLE SITE WITH INTRAOPERATIVE CHOLANGIOGRAM (N/A)  Patient Location: PACU  Anesthesia Type:General  Level of Consciousness: awake, alert  and oriented  Airway & Oxygen Therapy: Patient Spontanous Breathing and Patient connected to face mask oxygen  Post-op Assessment: Report given to RN and Post -op Vital signs reviewed and stable  Post vital signs: Reviewed and stable  Last Vitals:  Filed Vitals:   02/02/15 0934  BP: 154/83  Pulse: 81  Temp: 36.7 C  Resp: 18    Complications: No apparent anesthesia complications

## 2015-02-02 NOTE — Anesthesia Preprocedure Evaluation (Addendum)
Anesthesia Evaluation  Patient identified by MRN, date of birth, ID band Patient awake    Reviewed: Allergy & Precautions, H&P , NPO status , Patient's Chart, lab work & pertinent test results  History of Anesthesia Complications (+) PONV  Airway Mallampati: II  TM Distance: >3 FB Neck ROM: full    Dental no notable dental hx. (+) Dental Advisory Given, Teeth Intact   Pulmonary neg pulmonary ROS,    Pulmonary exam normal breath sounds clear to auscultation       Cardiovascular Exercise Tolerance: Good hypertension, Pt. on medications negative cardio ROS Normal cardiovascular exam Rhythm:regular Rate:Normal     Neuro/Psych negative neurological ROS  negative psych ROS   GI/Hepatic negative GI ROS, Neg liver ROS, GERD  Medicated and Controlled,  Endo/Other  negative endocrine ROSdiabetes, Well Controlled, Type 2, Oral Hypoglycemic Agents  Renal/GU negative Renal ROS  negative genitourinary   Musculoskeletal   Abdominal   Peds  Hematology negative hematology ROS (+)   Anesthesia Other Findings   Reproductive/Obstetrics negative OB ROS                            Anesthesia Physical Anesthesia Plan  ASA: III  Anesthesia Plan: General   Post-op Pain Management:    Induction: Intravenous  Airway Management Planned: Oral ETT  Additional Equipment:   Intra-op Plan:   Post-operative Plan: Extubation in OR  Informed Consent: I have reviewed the patients History and Physical, chart, labs and discussed the procedure including the risks, benefits and alternatives for the proposed anesthesia with the patient or authorized representative who has indicated his/her understanding and acceptance.   Dental Advisory Given  Plan Discussed with: CRNA and Surgeon  Anesthesia Plan Comments:         Anesthesia Quick Evaluation

## 2015-02-02 NOTE — Anesthesia Postprocedure Evaluation (Signed)
  Anesthesia Post-op Note  Patient: Margaret Bond  Procedure(s) Performed: Procedure(s) (LRB): LAPAROSCOPIC CHOLECYSTECTOMY SINGLE SITE WITH INTRAOPERATIVE CHOLANGIOGRAM (N/A)  Patient Location: PACU  Anesthesia Type: General  Level of Consciousness: awake and alert   Airway and Oxygen Therapy: Patient Spontanous Breathing  Post-op Pain: mild  Post-op Assessment: Post-op Vital signs reviewed, Patient's Cardiovascular Status Stable, Respiratory Function Stable, Patent Airway and No signs of Nausea or vomiting  Last Vitals:  Filed Vitals:   02/02/15 1415  BP: 135/70  Pulse: 75  Temp: 36.6 C  Resp: 16    Post-op Vital Signs: stable   Complications: No apparent anesthesia complications

## 2015-02-02 NOTE — Anesthesia Procedure Notes (Signed)
Procedure Name: Intubation Date/Time: 02/02/2015 11:13 AM Performed by: Noralyn Pick D Pre-anesthesia Checklist: Patient identified, Emergency Drugs available, Suction available and Patient being monitored Patient Re-evaluated:Patient Re-evaluated prior to inductionOxygen Delivery Method: Circle System Utilized Preoxygenation: Pre-oxygenation with 100% oxygen Intubation Type: IV induction Ventilation: Mask ventilation without difficulty Laryngoscope Size: Mac and 3 Grade View: Grade II Tube type: Oral Tube size: 7.5 mm Number of attempts: 1 Airway Equipment and Method: Stylet and Oral airway Placement Confirmation: ETT inserted through vocal cords under direct vision,  positive ETCO2 and breath sounds checked- equal and bilateral Secured at: 21 cm Tube secured with: Tape Dental Injury: Teeth and Oropharynx as per pre-operative assessment

## 2015-02-02 NOTE — Discharge Instructions (Signed)
Laparoscopic Cholecystectomy, Care After These instructions give you information about caring for yourself after your procedure. Your doctor may also give you more specific instructions. Call your doctor if you have any problems or questions after your procedure. HOME CARE Incision Care  Follow instructions from your doctor about how to take care of your cuts from surgery (incisions). Make sure you:  Wash your hands with soap and water before you change your bandage (dressing). If you cannot use soap and water, use hand sanitizer.  Change your bandage as told by your doctor.  Leave stitches (sutures), skin glue, or skin tape (adhesive) strips in place. They may need to stay in place for 2 weeks or longer. If tape strips get loose and curl up, you may trim the loose edges. Do not remove tape strips completely unless your doctor says it is okay.  Do not take baths, swim, or use a hot tub until your doctor says it is okay. Ask your doctor if you can take showers. You may only be allowed to take sponge baths. General Instructions  Take over-the-counter and prescription medicines only as told by your doctor.  Do not drive or use heavy machinery while taking prescription pain medicine.  Return to your normal diet as told by your doctor.  Do not lift anything that is heavier than 10 lb (4.5 kg).  Do not play contact sports for 1 week or until your doctor says it is okay. GET HELP IF:  You have redness, swelling, or pain at the site of your surgical cuts.  You have fluid, blood, or pus coming from your cuts.  You notice a bad smell coming from your cut area.  Your surgical cuts break open.  You have a fever. GET HELP RIGHT AWAY IF:   You have a rash.  You have trouble breathing.  You have chest pain.  You have pain in your shoulders (shoulder strap areas) that is getting worse.  You pass out (faint) or feel dizzy while you are standing.  You have very bad pain in your belly  (abdomen).  You feel sick to your stomach (nauseous) or throw up (vomit) for more than 1 day.   This information is not intended to replace advice given to you by your health care provider. Make sure you discuss any questions you have with your health care provider.   Document Released: 12/26/2007 Document Revised: 12/07/2014 Document Reviewed: 10/28/2012 Elsevier Interactive Patient Education 2016 [place]: POST OP INSTRUCTIONS  1. DIET: Follow a light bland diet the first 24 hours after arrival home, such as soup, liquids, crackers, etc.  Be sure to include lots of fluids daily.  Avoid fast food or heavy meals as your are more likely to get nauseated.  Eat a low fat the next few days after surgery.   2. Take your usually prescribed home medications unless otherwise directed. 3. PAIN CONTROL: a. Pain is best controlled by a usual combination of three different methods TOGETHER: i. Ice/Heat ii. Over the counter pain medication iii. Prescription pain medication b. Most patients will experience some swelling and bruising around the incisions.  Ice packs or heating pads (30-60 minutes up to 6 times a day) will help. Use ice for the first few days to help decrease swelling and bruising, then switch to heat to help relax tight/sore spots and speed recovery.  Some people prefer to use ice alone, heat alone, alternating between ice & heat.  Experiment to what works for you.  Swelling and bruising can take several weeks to resolve.   c. It is helpful to take an over-the-counter pain medication regularly for the first few weeks.  Choose one of the following that works best for you: i. Naproxen (Aleve, etc)  Two 220mg  tabs twice a day ii. Ibuprofen (Advil, etc) Three 200mg  tabs four times a day (every meal & bedtime) iii. Acetaminophen (Tylenol, etc) 500-650mg  four times a day (every meal & bedtime) d. A  prescription for pain medication (such as oxycodone,  hydrocodone, etc) should be given to you upon discharge.  Take your pain medication as prescribed.  i. If you are having problems/concerns with the prescription medicine (does not control pain, nausea, vomiting, rash, itching, etc), please call us [phone] to see if we need to switch you to a different pain medicine that will work better for you and/or control your side effect better. ii. If you need a refill on your pain medication, please contact your pharmacy.  They will contact our office to request authorization. Prescriptions will not be filled after 5 pm or on week-ends. 4. Avoid getting constipated.  Between the surgery and the pain medications, it is common to experience some constipation.  Increasing fluid intake and taking a fiber supplement (such as Metamucil, Citrucel, FiberCon, MiraLax, etc) 1-2 times a day regularly will usually help prevent this problem from occurring.  A mild laxative (prune juice, Milk of Magnesia, MiraLax, etc) should be taken according to package directions if there are no bowel movements after 48 hours.   5. Watch out for diarrhea.  If you have many loose bowel movements, simplify your diet to bland foods & liquids for a few days.  Stop any stool softeners and decrease your fiber supplement.  Switching to mild anti-diarrheal medications (Kayopectate, Pepto Bismol) can help.  If this worsens or does not improve, please call us. 6. Wash / shower every day.  You may shower over the dressings as they are waterproof.  Continue to shower over incision(s) after the dressing is off. 7. Remove your waterproof bandages 5 days after surgery.  You may leave the incision open to air.  You may replace a dressing/Band-Aid to cover the incision for comfort if you wish.  8. ACTIVITIES as tolerated:   a. You may resume regular (light) daily activities beginning the next day--such as daily self-care, walking, climbing stairs--gradually increasing activities as tolerated.  If you can  walk 30 minutes without difficulty, it is safe to try more intense activity such as jogging, treadmill, bicycling, low-impact aerobics, swimming, etc. b. Save the most intensive and strenuous activity for last such as sit-ups, heavy lifting, contact sports, etc  Refrain from any heavy lifting or straining until you are off narcotics for pain control.   c. DO NOT PUSH THROUGH PAIN.  Let pain be your guide: If it hurts to do something, don't do it.  Pain is your body warning you to avoid that activity for another week until the pain goes down. d. You may drive when you are no longer taking prescription pain medication, you can comfortably wear a seatbelt, and you can safely maneuver your car and apply brakes. e. Dennis Bast may have sexual intercourse when it is comfortable.  9. FOLLOW UP in our office a. Please call [facility] at [phone] to set up an appointment to see your surgeon in the office for a follow-up appointment approximately 2-3 weeks after your surgery. b. Make sure  that you call for this appointment the day you arrive home to insure a convenient appointment time. 10. IF YOU HAVE DISABILITY OR FAMILY LEAVE FORMS, BRING THEM TO THE OFFICE FOR PROCESSING.  DO NOT GIVE THEM TO YOUR DOCTOR.   WHEN TO CALL us [phone]: 1. Poor pain control 2. Reactions / problems with new medications (rash/itching, nausea, etc)  3. Fever over 101.5 F (38.5 C) 4. Inability to urinate 5. Nausea and/or vomiting 6. Worsening swelling or bruising 7. Continued bleeding from incision. 8. Increased pain, redness, or drainage from the incision   The clinic staff is available to answer your questions during regular business hours (8:30am-5pm).  Please dont hesitate to call and ask to speak to one of our nurses for clinical concerns.   If you have a medical emergency, go to the nearest emergency room or call 911.  A surgeon from [facility] is always on call at the [facility]  [facility], [place], [place], [place], [place]  [address] ? MAIN: [phone] ? TOLL FREE: [phone] ?  FAX [phone] [email or web address]  Managing Pain  Pain after surgery or related to activity is often due to strain/injury to muscle, tendon, nerves and/or incisions.  This pain is usually short-term and will improve in a few months.   Many people find it helpful to do the following things TOGETHER to help speed the process of healing and to get back to regular activity more quickly:  1. Avoid heavy physical activity at first a. No lifting greater than 20 pounds at first, then increase to lifting as tolerated over the next few weeks b. Do not push through the pain.  Listen to your body and avoid positions and maneuvers than reproduce the pain.  Wait a few days before trying something more intense c. Walking is okay as tolerated, but go slowly and stop when getting sore.  If you can walk 30 minutes without stopping or pain, you can try more intense activity (running, jogging, aerobics, cycling, swimming, treadmill, sex, sports, weightlifting, etc ) d. Remember: If it hurts to do it, then dont do it!  2. Take Anti-inflammatory medication a. Choose ONE of the following over-the-counter medications: i.            Acetaminophen 500mg  tabs (Tylenol) 1-2 pills with every meal and just before bedtime (avoid if you have liver problems) ii.            Naproxen 220mg  tabs (ex. Aleve) 1-2 pills twice a day (avoid if you have kidney, stomach, IBD, or bleeding problems) iii. Ibuprofen 200mg  tabs (ex. Advil, Motrin) 3-4 pills with every meal and just before bedtime (avoid if you have kidney, stomach, IBD, or bleeding problems) b. Take with food/snack around the clock for 1-2 weeks i. This helps the muscle and nerve tissues become less irritable and calm down faster  3. Use a Heating pad or Ice/Cold Pack a. 4-6 times a day b. May use warm bath/hottub  or  showers  4. Try Gentle Massage and/or Stretching  a. at the area of pain many times a day b. stop if you feel pain - do not overdo it  Try these steps together to help you body heal faster and avoid making things get worse.  Doing just one of these things may not be enough.    If you are not getting better after two weeks or are noticing you are getting worse, contact our office  for further advice; we may need to re-evaluate you & see what other things we can do to help.  GETTING TO GOOD BOWEL HEALTH. Irregular bowel habits such as constipation and diarrhea can lead to many problems over time.  Having one soft bowel movement a day is the most important way to prevent further problems.  The anorectal canal is designed to handle stretching and feces to safely manage our ability to get rid of solid waste (feces, poop, stool) out of our body.  BUT, hard constipated stools can act like ripping concrete bricks and diarrhea can be a burning fire to this very sensitive area of our body, causing inflamed hemorrhoids, anal fissures, increasing risk is perirectal abscesses, abdominal pain/bloating, an making irritable bowel worse.      The goal: ONE SOFT BOWEL MOVEMENT A DAY!  To have soft, regular bowel movements:   Drink plenty of fluids, consider 4-6 tall glasses of water a day.    Take plenty of fiber.  Fiber is the undigested part of plant food that passes into the colon, acting s natures broom to encourage bowel motility and movement.  Fiber can absorb and hold large amounts of water. This results in a larger, bulkier stool, which is soft and easier to pass. Work gradually over several weeks up to 6 servings a day of fiber (25g a day even more if needed) in the form of: o Vegetables -- Root (potatoes, carrots, turnips), leafy green (lettuce, salad greens, celery, spinach), or cooked high residue (cabbage, broccoli, etc) o Fruit -- Fresh (unpeeled skin & pulp), Dried (prunes, apricots, cherries, etc  ),  or stewed ( applesauce)  o Whole grain breads, pasta, etc (whole wheat)  o Bran cereals   Bulking Agents -- This type of water-retaining fiber generally is easily obtained each day by one of the following:  o Psyllium bran -- The psyllium plant is remarkable because its ground seeds can retain so much water. This product is available as Metamucil, Konsyl, Effersyllium, Per Diem Fiber, or the less expensive generic preparation in drug and health food stores. Although labeled a laxative, it really is not a laxative.  o Methylcellulose -- This is another fiber derived from wood which also retains water. It is available as Citrucel. o Polyethylene Glycol - and artificial fiber commonly called Miralax or Glycolax.  It is helpful for people with gassy or bloated feelings with regular fiber o Flax Seed - a less gassy fiber than psyllium  No reading or other relaxing activity while on the toilet. If bowel movements take longer than 5 minutes, you are too constipated  AVOID CONSTIPATION.  High fiber and water intake usually takes care of this.  Sometimes a laxative is needed to stimulate more frequent bowel movements, but   Laxatives are not a good long-term solution as it can wear the colon out.  They can help jump-start bowels if constipated, but should be relied on constantly without discussing with your doctor o Osmotics (Milk of Magnesia, Fleets phosphosoda, Magnesium citrate, MiraLax, GoLytely) are safer than  o Stimulants (Senokot, Castor Oil, Dulcolax, Ex Lax)    o Avoid taking laxatives for more than 7 days in a row.   IF SEVERELY CONSTIPATED, try a Bowel Retraining Program: o Do not use laxatives.  o Eat a diet high in roughage, such as bran cereals and leafy vegetables.  o Drink six (6) ounces of prune or apricot juice each morning.  o Eat two (2) large servings of stewed  fruit each day.  o Take one (1) heaping tablespoon of a psyllium-based bulking agent twice a day. Use sugar-free  sweetener when possible to avoid excessive calories.  o Eat a normal breakfast.  o Set aside 15 minutes after breakfast to sit on the toilet, but do not strain to have a bowel movement.  o If you do not have a bowel movement by the third day, use an enema and repeat the above steps.   Controlling diarrhea o Switch to liquids and simpler foods for a few days to avoid stressing your intestines further. o Avoid dairy products (especially milk & ice cream) for a short time.  The intestines often can lose the ability to digest lactose when stressed. o Avoid foods that cause gassiness or bloating.  Typical foods include beans and other legumes, cabbage, broccoli, and dairy foods.  Every person has some sensitivity to other foods, so listen to our body and avoid those foods that trigger problems for you. o Adding fiber (Citrucel, Metamucil, psyllium, Miralax) gradually can help thicken stools by absorbing excess fluid and retrain the intestines to act more normally.  Slowly increase the dose over a few weeks.  Too much fiber too soon can backfire and cause cramping & bloating. o Probiotics (such as active yogurt, Align, etc) may help repopulate the intestines and colon with normal bacteria and calm down a sensitive digestive tract.  Most studies show it to be of mild help, though, and such products can be costly. o Medicines: - Bismuth subsalicylate (ex. Kayopectate, Pepto Bismol) every 30 minutes for up to 6 doses can help control diarrhea.  Avoid if pregnant. - Loperamide (Immodium) can slow down diarrhea.  Start with two tablets (4mg  total) first and then try one tablet every 6 hours.  Avoid if you are having fevers or severe pain.  If you are not better or start feeling worse, stop all medicines and call your doctor for advice o Call your doctor if you are getting worse or not better.  Sometimes further testing (cultures, endoscopy, X-ray studies, bloodwork, etc) may be needed to help diagnose and treat  the cause of the diarrhea.  TROUBLESHOOTING IRREGULAR BOWELS 1) Avoid extremes of bowel movements (no bad constipation/diarrhea) 2) Miralax 17gm mixed in 8oz. water or juice-daily. May use BID as needed.  3) Gas-x,Phazyme, etc. as needed for gas & bloating.  4) Soft,bland diet. No spicy,greasy,fried foods.  5) Prilosec over-the-counter as needed  6) May hold gluten/wheat products from diet to see if symptoms improve.  7)  May try probiotics (Align, Activa, etc) to help calm the bowels down 7) If symptoms become worse call back immediately.  Cholecystitis Cholecystitis is inflammation of the gallbladder. It is often called a gallbladder attack. The gallbladder is a pear-shaped organ that lies beneath the liver on the right side of the body. The gallbladder stores bile, which is a fluid that helps the body to digest fats. If bile builds up in your gallbladder, your gallbladder becomes inflamed. This condition may occur suddenly (be acute). Repeat episodes of acute cholecystitis or prolonged episodes may lead to a long-term (chronic) condition. Cholecystitis is serious and it requires treatment.  CAUSES The most common cause of this condition is gallstones. Gallstones can block the tube (duct) that carries bile out of your gallbladder. This causes bile to build up. Other causes of this condition include:  Damage to the gallbladder due to a decrease in blood flow.  Infections in the bile ducts.  Scars  or kinks in the bile ducts.  Tumors in the liver, pancreas, or gallbladder. RISK FACTORS This condition is more likely to develop in:  People who have sickle cell disease.  People who take birth control pills or use estrogen.  People who have alcoholic liver disease.  People who have liver cirrhosis.  People who have their nutrition delivered through a vein (parenteral nutrition).  People who do not eat or drink (do fasting) for a long period of time.  People who are obese.  People  who have rapid weight loss.  People who are pregnant.  People who have increased triglyceride levels.  People who have pancreatitis. SYMPTOMS Symptoms of this condition include:  Abdominal pain, especially in the upper right area of the abdomen.  Abdominal tenderness or bloating.  Nausea.  Vomiting.  Fever.  Chills.  Yellowing of the skin and the whites of the eyes (jaundice). DIAGNOSIS This condition is diagnosed with a medical history and physical exam. You may also have other tests, including:  Imaging tests, such as:  An ultrasound of the gallbladder.  A CT scan of the abdomen.  A gallbladder nuclear scan (HIDA scan). This scan allows your health care provider to see the bile moving from your liver to your gallbladder and to your small intestine.  MRI.  Blood tests, such as:  A complete blood count, because the white blood cell count may be higher than normal.  Liver function tests, because some levels may be higher than normal with certain types of gallstones. TREATMENT Treatment may include:  Fasting for a certain amount of time.  IV fluids.  Medicine to treat pain or vomiting.  Antibiotic medicine.  Surgery to remove your gallbladder (cholecystectomy). This may happen immediately or at a later time. Sharon care will depend on your treatment. In general:  Take over-the-counter and prescription medicines only as told by your health care provider.  If you were prescribed an antibiotic medicine, take it as told by your health care provider. Do not stop taking the antibiotic even if you start to feel better.  Follow instructions from your health care provider about what to eat or drink. When you are allowed to eat, avoid eating or drinking anything that triggers your symptoms.  Keep all follow-up visits as told by your health care provider. This is important. SEEK MEDICAL CARE IF:  Your pain is not controlled with  medicine.  You have a fever. SEEK IMMEDIATE MEDICAL CARE IF:  Your pain moves to another part of your abdomen or to your back.  You continue to have symptoms or you develop new symptoms even with treatment.   This information is not intended to replace advice given to you by your health care provider. Make sure you discuss any questions you have with your health care provider.   Document Released: 03/18/2005 Document Revised: 12/07/2014 Document Reviewed: 06/29/2014 Elsevier Interactive Patient Education [employer].

## 2015-02-02 NOTE — Interval H&P Note (Signed)
History and Physical Interval Note:  02/02/2015 10:07 AM  Margaret Bond  has presented today for surgery, with the diagnosis of Biliary Colic  The various methods of treatment have been discussed with the patient and family. After consideration of risks, benefits and other options for treatment, the patient has consented to  Procedure(s): [place] CHOLANGIOGRAM (N/A) as a surgical intervention .  The patient's history has been reviewed, patient examined, no change in status, stable for surgery.  I have reviewed the patient's chart and labs.  Questions were answered to the patient's satisfaction.     Lydia Toren C.

## 2015-02-02 NOTE — H&P (View-Only) (Signed)
Margaret Bond 01/24/2015 1:46 PM Location: [facility] Patient #: [id number] DOB: [birth year] Married / Language: Margaret Bond / Race: White Female  History of Present Illness [name]; 01/24/2015 2:37 PM) Patient words: EVAL. GB.  The patient is a 69 year old female who presents for evaluation of gall stones. Patient sent by primary care physician [name]. Concern for symptomatic gallstones.  Pleasant active woman. Walks a half hour a day. Has had intermittent attacks of RIGHT upper quadrant pain. At least 6. Most in the past year. He wondered if it was heartburn or reflux and used to take Nexium forearm. He was in the RIGHT side. Often triggered at night after her larger meal. [name] foods were the worst culprit. Some nausea but no vomiting. Some bloating. No sick contacts or travel history. Not associated with physical activity. No history of hepatitis. No liver problems. Rarely drinks alcohol. No irritable bowel. No Crohn's. No inflammatory bowel disease. No ulcerative colitis. No hematochezia or melena. No wheat, gluten nor dairy allergies. Had colonoscopy screening that showed diverticulosis only in May of this year. [name] with [facility] gastroenterology. She had a abdominal hysterectomy in 2000 when she [name]. No other abdominal surgeries. Moves her bowels about every one-2 days.  Because of the recurrent attacks refractory to an antacid medication, she discussed with her primary care physician. Ultrasound ordered showing a large gallstone. Surgical consultation requested.                  Result Narrative TECHNIQUE: Realtime multiplanar grayscale and color Doppler ultrasound of the right upper quadrant was performed.  COMPARISON: None.   INDICATION: R10.11 RUQ pain  FINDINGS:  Pancreas: No focal abnormalities are identified. Visualization is limited.  Vasculature: No abdominal aortic  aneurysm. Visualized IVC is patent. Portal vein is patent with normal flow direction.  Hepatobiliary: No focal liver abnormalities are identified. There is a 2.3 cm stone in the gallbladder neck. No gallbladder wall thickening or sonographic Murphy sign. CBD size is within normal limits. No intrahepatic biliary ductal dilatation.  Right Kidney: Right kidney is of normal size. No hydronephrosis. No suspicious masses. Normal renal echotexture. 1.4 cm right renal cyst.  Additional/incidental: N./A.   IMPRESSION: 1. 2.3 cm stone in region of gallbladder neck. 2. Small right renal cyst.  Electronically Signed by [name]    Other Problems [name], CMA; 01/24/2015 1:47 PM) Diabetes Mellitus Gastroesophageal Reflux Disease High blood pressure Kidney Stone  Past Surgical History [name], CMA; 01/24/2015 1:47 PM) Colon Polyp Removal - Colonoscopy Hysterectomy (not due to cancer) - Complete  Diagnostic Studies History ([name], CMA; 01/24/2015 1:47 PM) Colonoscopy within last year Mammogram within last year Pap Smear 1-5 years ago  Allergies [name], CMA; 01/24/2015 1:50 PM) Sulfabenzamide *CHEMICALS* Decongest *COUGH/COLD/ALLERGY* Codeine Sulfate *ANALGESICS - OPIOID* Dizziness.  Medication History [name], CMA; 01/24/2015 1:52 PM) Alendronate Sodium (70MG  Tablet, Oral) Active. Fenofibrate (145MG  Tablet, Oral) Active. Benicar HCT (20-12.5MG  Tablet, Oral) Active. Crestor (5MG  Tablet, Oral) Active. Januvia (100MG  Tablet, Oral) Active. MetFORMIN HCl (500MG  Tablet, Oral) Active. Calcium (500MG  Tablet, Oral) Active. Vitamin D (2000UNIT Capsule, Oral) Active. Baby Aspirin (81MG  Tablet Chewable, Oral) Active. Medications Reconciled  Social History [name], CMA; 01/24/2015 1:47 PM) Alcohol use Occasional alcohol use. No drug use Tobacco use Former smoker.  Family History [name], [place]; 01/24/2015 1:47 PM) Cerebrovascular Accident Father. Colon Cancer Mother. Colon Polyps Father. Diabetes Mellitus Father. Hypertension Father, Mother.  Pregnancy /  Birth History [name], CMA; 01/24/2015 1:47 PM) Age at menarche 49 years. Age of menopause 55-55 Gravida 0     Review of Systems [name] CMA; 01/24/2015 1:47 PM) General Not Present- Appetite Loss, Chills, Fatigue, Fever, Night Sweats, Weight Gain and Weight Loss. Skin Not Present- Change in Wart/Mole, Dryness, Hives, Jaundice, New Lesions, Non-Healing Wounds, Rash and Ulcer. HEENT Present- Seasonal Allergies. Not Present- Earache, Hearing Loss, Hoarseness, Nose Bleed, Oral Ulcers, Ringing in the Ears, Sinus Pain, Sore Throat, Visual Disturbances, Wears glasses/contact lenses and Yellow Eyes. Respiratory Not Present- Bloody sputum, Chronic Cough, Difficulty Breathing, Snoring and Wheezing. Breast Not Present- Breast Mass, Breast Pain, Nipple Discharge and Skin Changes. Cardiovascular Not Present- Chest Pain, Difficulty Breathing Lying Down, Leg Cramps, Palpitations, Rapid Heart Rate, Shortness of Breath and Swelling of Extremities. Gastrointestinal Present- Indigestion. Not Present- Abdominal Pain, Bloating, Bloody Stool, Change in Bowel Habits, Chronic diarrhea, Constipation, Difficulty Swallowing, Excessive gas, Gets full quickly at meals, Hemorrhoids, Nausea, Rectal Pain and Vomiting. Female Genitourinary Not Present- Frequency, Nocturia, Painful Urination, Pelvic Pain and Urgency. Musculoskeletal Not Present- Back Pain, Joint Pain, Joint Stiffness, Muscle Pain, Muscle Weakness and Swelling of Extremities. Neurological Not Present- Decreased Memory, Fainting, Headaches, Numbness, Seizures, Tingling, Tremor, Trouble walking and Weakness. Psychiatric Not Present- Anxiety, Bipolar, Change in Sleep Pattern, Depression, Fearful and Frequent crying. Endocrine Present- Hot flashes. Not  Present- Cold Intolerance, Excessive Hunger, Hair Changes, Heat Intolerance and New Diabetes. Hematology Not Present- Easy Bruising, Excessive bleeding, Gland problems, HIV and Persistent Infections.  Vitals ([name] CMA; 01/24/2015 1:49 PM) 01/24/2015 1:49 PM Weight: 143 lb Height: 62in Body Surface Area: 1.66 m Body Mass Index: 26.15 kg/m  Pulse: 86 (Regular)  BP: 122/62 (Sitting, Left Arm, Standard)      Physical Exam [name]; 01/24/2015 2:34 PM)  General Mental Status-Alert. General Appearance-Not in acute distress, Not Sickly. Orientation-Oriented X3. Hydration-Well hydrated. Voice-Normal. Note: Pleasant. Half. Apple body habitus  Integumentary Global Assessment Upon inspection and palpation of skin surfaces of the - Axillae: non-tender, no inflammation or ulceration, no drainage. and Distribution of scalp and body hair is normal. General Characteristics Temperature - normal warmth is noted.  Head and Neck Head-normocephalic, atraumatic with no lesions or palpable masses. Face Global Assessment - atraumatic, no absence of expression. Neck Global Assessment - no abnormal movements, no bruit auscultated on the right, no bruit auscultated on the left, no decreased range of motion, non-tender. Trachea-midline. Thyroid Gland Characteristics - non-tender.  Eye Eyeball - Left-Extraocular movements intact, No Nystagmus. Eyeball - Right-Extraocular movements intact, No Nystagmus. Cornea - Left-No Hazy. Cornea - Right-No Hazy. Sclera/Conjunctiva - Left-No scleral icterus, No Discharge. Sclera/Conjunctiva - Right-No scleral icterus, No Discharge. Pupil - Left-Direct reaction to light normal. Pupil - Right-Direct reaction to light normal.  ENMT Ears Pinna - Left - no drainage observed, no generalized tenderness observed. Right - no drainage observed, no generalized tenderness observed. Nose and  Sinuses External Inspection of the Nose - no destructive lesion observed. Inspection of the nares - Left - quiet respiration. Right - quiet respiration. Mouth and Throat Lips - Upper Lip - no fissures observed, no pallor noted. Lower Lip - no fissures observed, no pallor noted. Nasopharynx - no discharge present. Oral Cavity/Oropharynx - Tongue - no dryness observed. Oral Mucosa - no cyanosis observed. Hypopharynx - no evidence of airway distress observed.  Chest and Lung Exam Inspection Movements - Normal and Symmetrical. Accessory muscles - No use of accessory muscles in breathing. Palpation Palpation of  the chest reveals - Non-tender. Auscultation Breath sounds - Normal and Clear.  Cardiovascular Auscultation Rhythm - Regular. Murmurs & Other Heart Sounds - Auscultation of the heart reveals - No Murmurs and No Systolic Clicks.  Abdomen Inspection Inspection of the abdomen reveals - No Visible peristalsis and No Abnormal pulsations. Umbilicus - No Bleeding, No Urine drainage. Palpation/Percussion Palpation and Percussion of the abdomen reveal - Soft, Non Tender, No Rebound tenderness, No Rigidity (guarding) and No Cutaneous hyperesthesia. Note: Mild discomfort in RIGHT upper quadrant but no Murphy sign. Rest of abdomen soft nontender nondistended. Somewhat overweight. No diastases. No umbilical hernia.  Female Genitourinary Sexual Maturity Tanner 5 - Adult hair pattern. Note: No vaginal bleeding nor discharge  Peripheral Vascular Upper Extremity Inspection - Left - No Cyanotic nailbeds, Not Ischemic. Right - No Cyanotic nailbeds, Not Ischemic.  Neurologic Neurologic evaluation reveals -normal attention span and ability to concentrate, able to name objects and repeat phrases. Appropriate fund of knowledge , normal sensation and normal coordination. Mental Status Affect - not angry, not paranoid. Cranial Nerves-Normal Bilaterally. Gait-Normal.  Neuropsychiatric Mental  status exam performed with findings of-able to articulate well with normal speech/language, rate, volume and coherence, thought content normal with ability to perform basic computations and apply abstract reasoning and no evidence of hallucinations, delusions, obsessions or homicidal/suicidal ideation.  Musculoskeletal Global Assessment Spine, Ribs and Pelvis - no instability, subluxation or laxity. Right Upper Extremity - no instability, subluxation or laxity.  Lymphatic Head & Neck  General Head & Neck Lymphatics: Bilateral - Description - No Localized lymphadenopathy. Axillary  General Axillary Region: Bilateral - Description - No Localized lymphadenopathy. Femoral & Inguinal  Generalized Femoral & Inguinal Lymphatics: Left - Description - No Localized lymphadenopathy. Right - Description - No Localized lymphadenopathy.    Assessment & Plan [name]; 01/24/2015 2:35 PM)  CHRONIC CHOLECYSTITIS WITH CALCULUS (K80.10) Impression: Classic story of biliary colic with large gallstone. Rest of the differential diagnosis seems unlikely.  I think she would benefit from cholecystectomy. Reasonable single site approach since only Pfannenstiel incision for hysterectomy. She is hoping to get this done and be able to recover and time for her vacation in early January.  Current Plans You are being scheduled for surgery - Our schedulers will call you.  You should hear from our office's scheduling department within 5 working days about the location, date, and time of surgery. We try to make accommodations for patient's preferences in scheduling surgery, but sometimes the OR schedule or the surgeon's schedule prevents [place] from making those accommodations.  If you have not heard from our office [phone]) in 5 working days, call the office and ask for your surgeon's nurse.  If you have other questions about your diagnosis, plan, or surgery, call the office and ask for your surgeon's  nurse.  Pt Education - Pamphlet Given - Laparoscopic Gallbladder Surgery: discussed with patient and provided information. Written instructions provided The anatomy & physiology of hepatobiliary & pancreatic function was discussed. The pathophysiology of gallbladder dysfunction was discussed. Natural history risks without surgery was discussed. I feel the risks of no intervention will lead to serious problems that outweigh the operative risks; therefore, I recommended cholecystectomy to remove the pathology. I explained laparoscopic techniques with possible need for an open approach. Probable cholangiogram to evaluate the bilary tract was explained as well.  Risks such as bleeding, infection, abscess, leak, injury to other organs, need for further treatment, heart attack, death, and other risks were discussed. I noted  a good likelihood this will help address the problem. Possibility that this will not correct all abdominal symptoms was explained. Goals of post-operative recovery were discussed as well. We will work to minimize complications. An educational handout further explaining the pathology and treatment options was given as well. Questions were answered. The patient expresses understanding & wishes to proceed with surgery.  Pt Education - [facility]CS Laparosopic Post Op HCI (Karess Harner) Pt Education - [facility] Good Bowel Health (Keonna Raether) Pt Education - Laparoscopic Cholecystectomy: gallbladder  Adin Hector, M.D., F.A.C.S. Gastrointestinal and Minimally Invasive Surgery [facility] [address] [place], [place] [address] [phone] Main / Paging

## 2015-05-11 ENCOUNTER — Other Ambulatory Visit: Payer: Self-pay

## 2015-05-11 DIAGNOSIS — Z1231 Encounter for screening mammogram for malignant neoplasm of breast: Secondary | ICD-10-CM

## 2015-06-20 ENCOUNTER — Ambulatory Visit: Payer: Medicare Other

## 2015-07-19 ENCOUNTER — Ambulatory Visit: Payer: Medicare Other

## 2015-08-08 ENCOUNTER — Ambulatory Visit
Admission: RE | Admit: 2015-08-08 | Discharge: 2015-08-08 | Disposition: A | Discharge status: Home / Self Care | Payer: Medicare Other | Source: Ambulatory Visit

## 2015-08-08 DIAGNOSIS — Z1231 Encounter for screening mammogram for malignant neoplasm of breast: Secondary | ICD-10-CM

## 2016-07-10 ENCOUNTER — Other Ambulatory Visit: Payer: Self-pay | Admitting: Family Medicine

## 2016-07-10 DIAGNOSIS — Z1231 Encounter for screening mammogram for malignant neoplasm of breast: Secondary | ICD-10-CM

## 2016-08-08 ENCOUNTER — Ambulatory Visit
Admission: RE | Admit: 2016-08-08 | Discharge: 2016-08-08 | Disposition: A | Discharge status: Home / Self Care | Payer: Medicare Other | Source: Ambulatory Visit | Attending: Family Medicine | Admitting: Family Medicine

## 2016-08-08 DIAGNOSIS — Z1231 Encounter for screening mammogram for malignant neoplasm of breast: Secondary | ICD-10-CM

## 2016-11-26 ENCOUNTER — Ambulatory Visit (INDEPENDENT_AMBULATORY_CARE_PROVIDER_SITE_OTHER): Payer: Medicare Other | Admitting: Allergy and Immunology

## 2016-11-26 ENCOUNTER — Encounter: Payer: Self-pay | Admitting: Allergy and Immunology

## 2016-11-26 VITALS — BP 120/74 | HR 78 | Temp 98.1°F | Resp 16 | Ht 62.0 in | Wt 136.0 lb

## 2016-11-26 DIAGNOSIS — J3089 Other allergic rhinitis: Secondary | ICD-10-CM | POA: Diagnosis not present

## 2016-11-26 DIAGNOSIS — H1013 Acute atopic conjunctivitis, bilateral: Secondary | ICD-10-CM

## 2016-11-26 DIAGNOSIS — H1010 Acute atopic conjunctivitis, unspecified eye: Secondary | ICD-10-CM | POA: Insufficient documentation

## 2016-11-26 MED ORDER — CARBINOXAMINE MALEATE 4 MG/5ML PO SOLN: 4.0000 mg | Freq: Two times a day (BID) | ORAL | 1 refills | Status: AC | PRN

## 2016-11-26 MED ORDER — AZELASTINE-FLUTICASONE 137-50 MCG/ACT NA SUSP: 2.0000 | NASAL | 5 refills | Status: DC

## 2016-11-26 NOTE — Progress Notes (Signed)
New Patient Note  RE: KETTY Bond MRN: [id number] DOB: 07-15-1945 Date of Office Visit: 11/26/2016  Referring provider: Leighton Ruff, MD Primary care provider: Leighton Ruff, MD  Chief Complaint: Allergic Rhinitis    History of present illness: Margaret Bond is a 71 y.o. female seen today in consultation requested by Leighton Ruff, MD.  She complains of rhinorrhea, postnasal drainage, irritated throat, nasal congestion, and occasional frontal and maxillary sinus pressure.  She reports that she had had similar symptoms while living in [place].  She started aeroallergen immunotherapy with definite symptom reduction, however stopped immunotherapy when she moved from [place] to [place] in 2005.  The nasal/ocular/sinus symptoms started to recur 3 or 4 years ago and have been progressing since that time.  These symptoms occur year around but tend to be more severe during the fall, particularly with rapid weather changes.  She is unable to take decongestants due to hypertension.  She occasionally uses a medicated nasal spray, antihistamine, or ibuprofen for sinus pressure, though she prefers not to use medications if possible.  She has no history of asthma, eczema, or food allergies.   Assessment and plan: Allergic rhinitis with a non-allergic component  Aeroallergen avoidance measures have been discussed and provided in written form.  A prescription has been provided for carbinoxamine 4-8 mg every 12 hours as needed.  A prescription has been provided for azelastine nasal spray, 1-2 sprays per nostril 2 times daily as needed. Proper nasal spray technique has been discussed and demonstrated.   I have also recommended nasal saline spray (i.e., Simply Saline) or nasal saline lavage (i.e., NeilMed) as needed and prior to medicated nasal sprays.  Allergic conjunctivitis  Treatment plan as outlined above for allergic rhinitis.  A prescription has been provided for Patanol, one  drop per eye twice daily as needed.   Meds ordered this encounter  Medications  . Azelastine-Fluticasone 137-50 MCG/ACT SUSP    Sig: Place 2 sprays into the nose See admin instructions. 1-2 times daily prn    Dispense:  23 g    Refill:  5  . Carbinoxamine Maleate 4 MG/5ML SOLN    Sig: Take 5-10 mLs (4-8 mg total) by mouth 2 (two) times daily as needed.    Dispense:  280 mL    Refill:  1    Diagnostics: Epicutaneous testing: Positive to tree pollen. Intradermal testing:  Negative.    Physical examination: Blood pressure 120/74, pulse 78, temperature 98.1 F (36.7 C), temperature source Oral, resp. rate 16, height 5\' 2"  (1.575 m), weight 136 lb (61.7 kg), SpO2 97 %.  General: Alert, interactive, in no acute distress. HEENT: TMs pearly gray, turbinates moderately edematous without discharge, post-pharynx moderately erythematous. Neck: Supple without lymphadenopathy. Lungs: Clear to auscultation without wheezing, rhonchi or rales. CV: Normal S1, S2 without murmurs. Abdomen: Nondistended, nontender. Skin: Warm and dry, without lesions or rashes. Extremities:  No clubbing, cyanosis or edema. Neuro:   Grossly intact.  Review of systems:  Review of systems negative except as noted in HPI / PMHx or noted below: Review of Systems  Constitutional: Negative.   HENT: Negative.   Eyes: Negative.   Respiratory: Negative.   Cardiovascular: Negative.   Gastrointestinal: Negative.   Genitourinary: Negative.   Musculoskeletal: Negative.   Skin: Negative.   Neurological: Negative.   Endo/Heme/Allergies: Negative.   Psychiatric/Behavioral: Negative.     Past medical history:  Past Medical History:  Diagnosis Date  . Complication of anesthesia    itchy  and hyper after anesthesia, nausea likes phenergan  . Diabetes mellitus without complication ([place])   . Gallstones   . GERD (gastroesophageal reflux disease)   . Hypertension   . PONV (postoperative nausea and vomiting)   .  Urticaria     Past surgical history:  Past Surgical History:  Procedure Laterality Date  . ABDOMINAL HYSTERECTOMY     complete  . colonscopy    . ESOPHAGOGASTRODUODENOSCOPY ENDOSCOPY    . LAPAROSCOPIC CHOLECYSTECTOMY SINGLE SITE WITH INTRAOPERATIVE CHOLANGIOGRAM N/A 02/02/2015   Procedure: LAPAROSCOPIC CHOLECYSTECTOMY SINGLE SITE WITH INTRAOPERATIVE CHOLANGIOGRAM;  Surgeon: Michael Boston, MD;  Location: [facility];  Service: General;  Laterality: N/A;    Family history: Family History  Problem Relation Age of Onset  . Allergic rhinitis Father     Social history: Social History   Social History  . Marital status: Married    Spouse name: N/A  . Number of children: N/A  . Years of education: N/A   Occupational History  . Not on file.   Social History Main Topics  . Smoking status: Never Smoker  . Smokeless tobacco: Never Used  . Alcohol use Yes     Comment: occasional  . Drug use: No  . Sexual activity: Not on file   Other Topics Concern  . Not on file   Social History Narrative  . No narrative on file   Environmental History: The patient lives in a townhouse with carpeting in the bedroom, gas heat, and central air.  There is a In the home which has access to her bedroom.  There is no known mold/water damage in the home.  She is a former cigarette smoker having started in 1970 and quit in 1980.  During that time, she smoked 1 pack per day, or less, on average.  Allergies as of 11/26/2016      Reactions   Codeine Other (See Comments)   Makes her feel "high" and causes insomnia   Decongestant [pseudoephedrine Hcl] Other (See Comments)   Makes her feel "high" and "hyper"   Sulfa Antibiotics Rash      Medication List       Accurate as of 11/26/16  1:15 PM. Always use your most recent med list.          alendronate 70 MG tablet Commonly known as:  FOSAMAX Take 70 mg by mouth once a week. TAKES ON SATURDAY   Azelastine-Fluticasone 137-50 MCG/ACT Susp Place 2  sprays into the nose See admin instructions. 1-2 times daily prn   BENICAR HCT 20-12.5 MG tablet Generic drug:  olmesartan-hydrochlorothiazide Take 1 tablet by mouth daily.   CALCIUM + D PO Take 1 tablet by mouth daily.   Carbinoxamine Maleate 4 MG/5ML Soln Take 5-10 mLs (4-8 mg total) by mouth 2 (two) times daily as needed.   esomeprazole 40 MG capsule Commonly known as:  NEXIUM Take 40 mg by mouth daily as needed (for acid reflux).   fenofibrate 145 MG tablet Commonly known as:  TRICOR Take 145 mg by mouth daily.   JANUVIA 100 MG tablet Generic drug:  sitaGLIPtin Take 100 mg by mouth daily.   metFORMIN 500 MG tablet Commonly known as:  GLUCOPHAGE Take 500 mg by mouth 3 (three) times daily.   rosuvastatin 5 MG tablet Commonly known as:  CRESTOR Take 5 mg by mouth daily.   Vitamin D 2000 units tablet Take 2,000 Units by mouth daily.            Discharge Care  Instructions        Start     Ordered   11/26/16 0000  Allergy Test    Question:  Allergy test to perform  Answer:  1-59   11/26/16 1213   11/26/16 0000  Interdermal Allergy Test    Question Answer Comment  Allergens Control   Allergens Mold 3   Allergens Mold 4   Allergens [place]   Allergens Johnson   Allergens Cat   Allergens Dog   Allergens Cockroach   Allergens Weed Mix   Allergens Mite Mix   Allergens Ragweed Mix   Allergens Mold 1   Allergens Mold 2   Allergens 7 Grass      11/26/16 1213   11/26/16 0000  Azelastine-Fluticasone 137-50 MCG/ACT SUSP  See admin instructions     11/26/16 1213   11/26/16 0000  Carbinoxamine Maleate 4 MG/5ML SOLN  2 times daily PRN     11/26/16 1213      Known medication allergies: Allergies  Allergen Reactions  . Codeine Other (See Comments)    Makes her feel "high" and causes insomnia  . Decongestant [Pseudoephedrine Hcl] Other (See Comments)    Makes her feel "high" and "hyper"  . Sulfa Antibiotics Rash    I appreciate the opportunity to take  part in Margaret Bond's care. Please do not hesitate to contact me with questions.  Sincerely,   R. Edgar Frisk, MD

## 2016-11-26 NOTE — Assessment & Plan Note (Addendum)
   Aeroallergen avoidance measures have been discussed and provided in written form.  A prescription has been provided for carbinoxamine 4-8 mg every 12 hours as needed.  A prescription has been provided for azelastine nasal spray, 1-2 sprays per nostril 2 times daily as needed. Proper nasal spray technique has been discussed and demonstrated.   I have also recommended nasal saline spray (i.e., Simply Saline) or nasal saline lavage (i.e., NeilMed) as needed and prior to medicated nasal sprays.

## 2016-11-26 NOTE — Patient Instructions (Addendum)
Allergic rhinitis with a non-allergic component  Aeroallergen avoidance measures have been discussed and provided in written form.  A prescription has been provided for carbinoxamine 4-8 mg every 12 hours as needed.  A prescription has been provided for azelastine nasal spray, 1-2 sprays per nostril 2 times daily as needed. Proper nasal spray technique has been discussed and demonstrated.   I have also recommended nasal saline spray (i.e., Simply Saline) or nasal saline lavage (i.e., NeilMed) as needed and prior to medicated nasal sprays.  Allergic conjunctivitis  Treatment plan as outlined above for allergic rhinitis.  A prescription has been provided for Patanol, one drop per eye twice daily as needed.   Return in about 6 months (around 05/29/2017), or if symptoms worsen or fail to improve.  Reducing Pollen Exposure  The American Academy of Allergy, Asthma and Immunology suggests the following steps to reduce your exposure to pollen during allergy seasons.    1. Do not hang sheets or clothing out to dry; pollen may collect on these items. 2. Do not mow lawns or spend time around freshly cut grass; mowing stirs up pollen. 3. Keep windows closed at night.  Keep car windows closed while driving. 4. Minimize morning activities outdoors, a time when pollen counts are usually at their highest. 5. Stay indoors as much as possible when pollen counts or humidity is high and on windy days when pollen tends to remain in the air longer. 6. Use air conditioning when possible.  Many air conditioners have filters that trap the pollen spores. 7. Use a HEPA room air filter to remove pollen form the indoor air you breathe.

## 2016-11-26 NOTE — Assessment & Plan Note (Signed)
   Treatment plan as outlined above for allergic rhinitis.  A prescription has been provided for Patanol, one drop per eye twice daily as needed. 

## 2016-11-27 ENCOUNTER — Telehealth: Payer: Self-pay | Admitting: *Deleted

## 2016-11-27 MED ORDER — FLUTICASONE PROPIONATE 50 MCG/ACT NA SUSP: NASAL | 5 refills | Status: AC

## 2016-11-27 MED ORDER — AZELASTINE HCL 0.15 % NA SOLN: NASAL | 5 refills | Status: AC

## 2016-11-27 NOTE — Telephone Encounter (Signed)
Azelastine 0.15% and also fluticasone nasal spray.

## 2016-11-27 NOTE — Telephone Encounter (Signed)
I left a detailed message for the patient advising on prescription change.

## 2016-11-27 NOTE — Addendum Note (Signed)
Addended by: Lucrezia Starch I on: 11/27/2016 02:34 PM   Modules accepted: Orders

## 2016-11-27 NOTE — Telephone Encounter (Signed)
Alternative requested from pharmacy for Margaret Bond after insurance pt is paying over $150. Azelastine 0.1 and 0.15 strength preferred. Please advise

## 2016-11-28 ENCOUNTER — Telehealth: Payer: Self-pay | Admitting: Allergy and Immunology

## 2016-11-28 MED ORDER — OLOPATADINE HCL 0.1 % OP SOLN: 1.0000 [drp] | Freq: Two times a day (BID) | OPHTHALMIC | 3 refills | Status: AC | PRN

## 2016-11-28 NOTE — Telephone Encounter (Signed)
Patient was seen this week and was given a script for a nose spray and eye drops.  The nose spray was sent to the pharmacy however, the eye drops were not.  [facility]  Also patient was tested and has noticed three bumps on her arm where testing was performed - and one is itching more now, and didn't during testing  Please call patient

## 2016-11-28 NOTE — Telephone Encounter (Signed)
Prescription has been sent to pharmacy. I also advised on delayed reaction with some of the IDs that we did. Advised patient to use hydrocortisone cream to help relieve the itching.

## 2016-11-29 ENCOUNTER — Telehealth: Payer: Self-pay | Admitting: Allergy and Immunology

## 2016-11-29 NOTE — Telephone Encounter (Signed)
Received fax from pharmacy about Dymista not being covered however it looks like someone has already sent in the alternatives.

## 2017-07-28 ENCOUNTER — Other Ambulatory Visit: Payer: Self-pay | Admitting: Family Medicine

## 2017-07-28 DIAGNOSIS — Z1231 Encounter for screening mammogram for malignant neoplasm of breast: Secondary | ICD-10-CM

## 2017-08-27 ENCOUNTER — Ambulatory Visit
Admission: RE | Admit: 2017-08-27 | Discharge: 2017-08-27 | Disposition: A | Discharge status: Home / Self Care | Payer: Medicare Other | Source: Ambulatory Visit | Attending: Family Medicine | Admitting: Family Medicine

## 2017-08-27 DIAGNOSIS — Z1231 Encounter for screening mammogram for malignant neoplasm of breast: Secondary | ICD-10-CM

## 2017-12-29 ENCOUNTER — Telehealth: Payer: Self-pay | Admitting: *Deleted

## 2017-12-29 NOTE — Telephone Encounter (Signed)
Received refill request from [facility] for fluticasone faxed back denial [phone] due to patient not being seen since 10/2016

## 2018-01-15 ENCOUNTER — Other Ambulatory Visit: Payer: Self-pay | Admitting: Family Medicine

## 2018-01-15 DIAGNOSIS — R5381 Other malaise: Secondary | ICD-10-CM

## 2018-01-16 ENCOUNTER — Other Ambulatory Visit: Payer: Self-pay | Admitting: Family Medicine

## 2018-01-16 DIAGNOSIS — E2839 Other primary ovarian failure: Secondary | ICD-10-CM

## 2018-01-27 ENCOUNTER — Other Ambulatory Visit: Payer: Self-pay | Admitting: Family Medicine

## 2018-01-27 DIAGNOSIS — M8580 Other specified disorders of bone density and structure, unspecified site: Secondary | ICD-10-CM

## 2018-01-27 DIAGNOSIS — E2839 Other primary ovarian failure: Secondary | ICD-10-CM

## 2018-04-07 ENCOUNTER — Ambulatory Visit
Admission: RE | Admit: 2018-04-07 | Discharge: 2018-04-07 | Disposition: A | Discharge status: Home / Self Care | Payer: Medicare Other | Source: Ambulatory Visit | Attending: Family Medicine | Admitting: Family Medicine

## 2018-04-07 DIAGNOSIS — M8580 Other specified disorders of bone density and structure, unspecified site: Secondary | ICD-10-CM

## 2018-04-07 DIAGNOSIS — E2839 Other primary ovarian failure: Secondary | ICD-10-CM

## 2018-07-21 ENCOUNTER — Other Ambulatory Visit: Payer: Self-pay | Admitting: Family Medicine

## 2018-07-21 DIAGNOSIS — Z1231 Encounter for screening mammogram for malignant neoplasm of breast: Secondary | ICD-10-CM

## 2018-09-23 ENCOUNTER — Ambulatory Visit
Admission: RE | Admit: 2018-09-23 | Discharge: 2018-09-23 | Disposition: A | Discharge status: Home / Self Care | Payer: Medicare Other | Source: Ambulatory Visit | Attending: Family Medicine | Admitting: Family Medicine

## 2018-09-23 ENCOUNTER — Other Ambulatory Visit: Payer: Self-pay

## 2018-09-23 DIAGNOSIS — Z1231 Encounter for screening mammogram for malignant neoplasm of breast: Secondary | ICD-10-CM

## 2019-08-27 ENCOUNTER — Other Ambulatory Visit: Payer: Self-pay | Admitting: Nephrology

## 2019-08-27 DIAGNOSIS — N183 Chronic kidney disease, stage 3 unspecified: Secondary | ICD-10-CM

## 2019-08-27 DIAGNOSIS — I10 Essential (primary) hypertension: Secondary | ICD-10-CM

## 2019-09-03 ENCOUNTER — Ambulatory Visit
Admission: RE | Admit: 2019-09-03 | Discharge: 2019-09-03 | Disposition: A | Discharge status: Home / Self Care | Payer: Medicare Other | Source: Ambulatory Visit | Attending: Nephrology | Admitting: Nephrology

## 2019-09-03 DIAGNOSIS — N183 Chronic kidney disease, stage 3 unspecified: Secondary | ICD-10-CM

## 2019-09-03 DIAGNOSIS — I10 Essential (primary) hypertension: Secondary | ICD-10-CM

## 2019-09-20 ENCOUNTER — Other Ambulatory Visit: Payer: Self-pay | Admitting: Family Medicine

## 2019-09-20 DIAGNOSIS — Z1231 Encounter for screening mammogram for malignant neoplasm of breast: Secondary | ICD-10-CM

## 2019-09-28 ENCOUNTER — Ambulatory Visit
Admission: RE | Admit: 2019-09-28 | Discharge: 2019-09-28 | Disposition: A | Discharge status: Home / Self Care | Payer: Medicare Other | Source: Ambulatory Visit | Attending: Family Medicine | Admitting: Family Medicine

## 2019-09-28 ENCOUNTER — Other Ambulatory Visit: Payer: Self-pay

## 2019-09-28 DIAGNOSIS — Z1231 Encounter for screening mammogram for malignant neoplasm of breast: Secondary | ICD-10-CM

## 2020-09-11 ENCOUNTER — Other Ambulatory Visit: Payer: Self-pay | Admitting: Family Medicine

## 2020-09-11 DIAGNOSIS — Z1231 Encounter for screening mammogram for malignant neoplasm of breast: Secondary | ICD-10-CM

## 2020-11-16 ENCOUNTER — Ambulatory Visit: Payer: Medicare Other

## 2020-12-21 ENCOUNTER — Other Ambulatory Visit: Payer: Self-pay

## 2020-12-21 ENCOUNTER — Ambulatory Visit
Admission: RE | Admit: 2020-12-21 | Discharge: 2020-12-21 | Disposition: A | Discharge status: Home / Self Care | Payer: Medicare Other | Source: Ambulatory Visit | Attending: Family Medicine | Admitting: Family Medicine

## 2020-12-21 DIAGNOSIS — Z1231 Encounter for screening mammogram for malignant neoplasm of breast: Secondary | ICD-10-CM

## 2021-04-23 ENCOUNTER — Encounter: Payer: Self-pay | Admitting: Physical Therapy

## 2021-04-23 ENCOUNTER — Ambulatory Visit: Payer: Medicare Other | Attending: Family Medicine | Admitting: Physical Therapy

## 2021-04-23 ENCOUNTER — Other Ambulatory Visit: Payer: Self-pay

## 2021-04-23 DIAGNOSIS — M6281 Muscle weakness (generalized): Secondary | ICD-10-CM | POA: Diagnosis present

## 2021-04-23 DIAGNOSIS — R279 Unspecified lack of coordination: Secondary | ICD-10-CM | POA: Diagnosis present

## 2021-04-23 NOTE — Therapy (Signed)
[facility] @ [place], [place], [address] Phone: [phone]   Fax:  [phone]  Physical Therapy Evaluation  Patient Details  Name: Margaret Bond MRN: [id number] Date of Birth: 1945/05/31 Referring Provider (PT): Dr. Cari Caraway   Encounter Date: 04/23/2021   PT End of Session - 04/23/21 1510     Visit Number 1    Date for PT Re-Evaluation 07/16/21    Authorization Type [place] - Visit Number 1    Authorization - Number of Visits 10    PT Start Time 7408    PT Stop Time 1525    PT Time Calculation (min) 40 min    Activity Tolerance Patient tolerated treatment well    Behavior During Therapy WFL for tasks assessed/performed             Past Medical History:  Diagnosis Date   Complication of anesthesia    itchy and hyper after anesthesia, nausea likes phenergan   Diabetes mellitus without complication (HCC)    Gallstones    GERD (gastroesophageal reflux disease)    Hypertension    PONV (postoperative nausea and vomiting)    Urticaria     Past Surgical History:  Procedure Laterality Date   ABDOMINAL HYSTERECTOMY     complete   colonscopy     ESOPHAGOGASTRODUODENOSCOPY ENDOSCOPY     LAPAROSCOPIC CHOLECYSTECTOMY SINGLE SITE WITH INTRAOPERATIVE CHOLANGIOGRAM N/A 02/02/2015   Procedure: LAPAROSCOPIC CHOLECYSTECTOMY SINGLE SITE WITH INTRAOPERATIVE CHOLANGIOGRAM;  Surgeon: Michael Boston, MD;  Location: [facility];  Service: General;  Laterality: N/A;    There were no vitals filed for this visit.    Subjective Assessment - 04/23/21 1456     Subjective Patient reports she cannot hold her urine. sometimes gets urgency. Wears a minipad all the time. She had a hysterectomy 2000 and has been leaking since then.    Patient Stated Goals reduce her leakage.    Currently in Pain? No/denies    Multiple Pain Sites No                [facility] PT Assessment - 04/23/21 0001        Assessment   Medical Diagnosis R32 Urinary incontinence in female    Referring Provider (PT) Dr. Cari Caraway    Onset Date/Surgical Date --   07/1998   Prior Therapy none      Precautions   Precautions None      Restrictions   Weight Bearing Restrictions No      Balance Screen   Has the patient fallen in the past 6 months No    Has the patient had a decrease in activity level because of a fear of falling?  No    Is the patient reluctant to leave their home because of a fear of falling?  No      Home Ecologist residence      Prior Function   Level of Independence Independent    Vocation Retired    Leisure walk daily, 40 minutes daily      Cognition   Overall Cognitive Status Within Functional Limits for tasks assessed      Posture/Postural Control   Posture/Postural Control No significant limitations      ROM / Strength   AROM / PROM / Strength AROM;PROM;Strength      AROM   Overall AROM Comments lumbar ROM decreased by 25%  Strength   Left Hip Extension 4/5      Palpation   Palpation comment decreased movement of the lower rib cage with exhalation                        Objective measurements completed on examination: See above findings.     Pelvic Floor Special Questions - 04/23/21 0001     Prior Pregnancies No    Currently Sexually Active No    Urinary Leakage Yes    Pad use 1-2 mini pad per day    Activities that cause leaking With strong urge;Coughing;Sneezing;Laughing;Other    Other activities that cause leaking gradual leak all the time    Fecal incontinence No    Caffeine beverages hot tea in the morning    Falling out feeling (prolapse) No    Skin Integrity Intact    Perineal Body/Introitus  Elevated;Other    Perineal Body/Introitus other decreased mobility    Prolapse None    Pelvic Floor Internal Exam Patient confirmed identification and approves PT to assess pelvic floor and treatment     Exam Type Vaginal    Palpation weaker on the sides; tightness in the perineal body, tenderness in the posterior vaginal canal, along the levator ani, decreased tissue mobility on bilateral sides of the bladder    Strength weak squeeze, no lift                         PT Short Term Goals - 04/23/21 1523       PT SHORT TERM GOAL #1   Title independent with initial HEP for pelvic floor bulging and diaphragmatic breathing    Time 4    Period Weeks    Status New    [place] Date 05/21/21      PT SHORT TERM GOAL #2   Title able to breath with full excursion of his lower rib cage with reducing her rib angle    Time 4    Period Weeks    Status New    Target Date 05/21/21      PT SHORT TERM GOAL #3   Title able to form a circular contraction with strength of pelvic floor >/= 3/5    Time 4    Period Weeks    Status New    Target Date 05/21/21               PT Long Term Goals - 04/23/21 1548       PT LONG TERM GOAL #1   Title independent with advanced HEP for pelvic floor and core strength    Time 12    Period Weeks    Status New    Target Date 07/16/21      PT LONG TERM GOAL #2   Title pelvic floor strength >/= 3/5 holding for 10 seconds so her urinary leakage decreased >/= 80%    Time 12    Period Weeks    Status New    Target Date 07/16/21      PT LONG TERM GOAL #3   Title able to quickly contract the pelvic floor to reduce leakage with coughing, laughing, and sneezig    Time 12    Period Weeks    Status New    Target Date 07/16/21      PT LONG TERM GOAL #4   Title patient is wearing a pad for just in case not due  to urinary leakage due to improved pelvic floor coordination and strength    Time 12    Period Weeks    Status New    Target Date 07/16/21                    Plan - 04/23/21 1542     Clinical Impression Statement Patient is a 76 year old female with stress incontinence since 07/1998 when she had a total hysterectomy.  Patient reported she would have pain with intercourse and leak urine at this time. Patient will leak urine with strong urge, laugh, cough and sneeze. Pelvic floor strength is 2/5 with most contraction anterior and posterior. She has palpabe tenderness located on the posterior vaginal canal, bilateral levator ani, and sides of the bladder. She has decreased mobility of her scar in the lower abdomen from the hysterectomy. Patient has difficulty with contracting the upper abdominals and obliques. Lumbar ROM is decreased by 25%. Her right hip extension 4/5. Patient would benefit from skilled therapy to improve pelvic floor contraction and coordination to reduce her leakage.    Personal Factors and Comorbidities Fitness;Age;Past/Current Experience;Comorbidity 3+    Comorbidities Diabetes; h/o Melanoma on her back; osteopenia; Abdominal Hystorectomy 07/1998    Examination-Activity Limitations Continence    Stability/Clinical Decision Making Evolving/Moderate complexity    Clinical Decision Making Low    Rehab Potential Excellent    PT Frequency 1x / week    PT Duration 12 weeks    PT Treatment/Interventions ADLs/Self Care Home Management;Biofeedback;Therapeutic activities;Therapeutic exercise;Neuromuscular re-education;Patient/family education;Manual techniques;Dry needling    PT Next Visit Plan manual work to pelvic floor to work on circular contraction and to posterior vaginal wall; work on decreasing rib angle and diaphragmatic breathing    Consulted and Agree with Plan of Care Patient             Patient will benefit from skilled therapeutic intervention in order to improve the following deficits and impairments:  Decreased coordination, Decreased range of motion, Increased fascial restricitons, Decreased endurance, Decreased activity tolerance, Decreased strength  Visit Diagnosis: Muscle weakness (generalized) - Plan: PT plan of care cert/re-cert  Unspecified lack of coordination - Plan: PT  plan of care cert/re-cert     Problem List Patient Active Problem List   Diagnosis Date Noted   Allergic rhinitis with a non-allergic component 11/26/2016   Allergic conjunctivitis 11/26/2016   Chronic cholecystitis with calculus 02/02/2015   Diabetes mellitus without complication ([place])    GERD (gastroesophageal reflux disease)     Earlie Counts, PT 04/23/21 3:54 PM   [facility] @ [place], [place], [address] Phone: [phone]   Fax:  [phone]  Name: Margaret Bond MRN: [id number] Date of Birth: 1945-04-27

## 2021-05-02 ENCOUNTER — Other Ambulatory Visit: Payer: Self-pay | Admitting: Nephrology

## 2021-05-02 DIAGNOSIS — N1832 Chronic kidney disease, stage 3b: Secondary | ICD-10-CM

## 2021-05-10 DIAGNOSIS — E1165 Type 2 diabetes mellitus with hyperglycemia: Secondary | ICD-10-CM | POA: Diagnosis not present

## 2021-05-14 ENCOUNTER — Encounter: Payer: Self-pay | Admitting: Physical Therapy

## 2021-05-14 ENCOUNTER — Other Ambulatory Visit: Payer: Self-pay

## 2021-05-14 ENCOUNTER — Ambulatory Visit: Payer: Medicare Other | Attending: Family Medicine | Admitting: Physical Therapy

## 2021-05-14 DIAGNOSIS — R279 Unspecified lack of coordination: Secondary | ICD-10-CM | POA: Diagnosis not present

## 2021-05-14 DIAGNOSIS — M6281 Muscle weakness (generalized): Secondary | ICD-10-CM | POA: Insufficient documentation

## 2021-05-14 NOTE — Patient Instructions (Signed)
Access Code: [email or web address] URL: [email or web address] Date: 05/14/2021 Prepared by: Earlie Counts  Exercises Sidelying Shoulder Horizontal Abduction - 1 x daily - 3 x weekly - 2 sets - 10 reps Supine Bridge with Mini Swiss Ball Between Knees - 1 x daily - 3 x weekly - 1 sets - 10 reps Hooklying Transversus Abdominis Palpation - 1 x daily - 7 x weekly - 1 sets - 10 reps [facility] [address], [place], [place] [address] Phone # [phone] Fax [phone]

## 2021-05-14 NOTE — Therapy (Signed)
[facility] @ [place], [place], [address] Phone: [phone]   Fax:  [phone]  Physical Therapy Treatment  Patient Details  Name: Margaret Bond MRN: [id number] Date of Birth: Jul 30, 1945 Referring Provider (PT): Dr. Cari Caraway   Encounter Date: 05/14/2021   PT End of Session - 05/14/21 0848     Visit Number 2    Date for PT Re-Evaluation 07/16/21    Authorization Type [employer] medicare    Authorization - Visit Number 2    Authorization - Number of Visits 10    PT Start Time 0845    PT Stop Time 0925    PT Time Calculation (min) 40 min    Activity Tolerance Patient tolerated treatment well    Behavior During Therapy WFL for tasks assessed/performed             Past Medical History:  Diagnosis Date   Complication of anesthesia    itchy and hyper after anesthesia, nausea likes phenergan   Diabetes mellitus without complication (HCC)    Gallstones    GERD (gastroesophageal reflux disease)    Hypertension    PONV (postoperative nausea and vomiting)    Urticaria     Past Surgical History:  Procedure Laterality Date   ABDOMINAL HYSTERECTOMY     complete   colonscopy     ESOPHAGOGASTRODUODENOSCOPY ENDOSCOPY     LAPAROSCOPIC CHOLECYSTECTOMY SINGLE SITE WITH INTRAOPERATIVE CHOLANGIOGRAM N/A 02/02/2015   Procedure: LAPAROSCOPIC CHOLECYSTECTOMY SINGLE SITE WITH INTRAOPERATIVE CHOLANGIOGRAM;  Surgeon: Michael Boston, MD;  Location: [facility];  Service: General;  Laterality: N/A;    There were no vitals filed for this visit.   Subjective Assessment - 05/14/21 0848     Subjective I feel like the leakage is a little better. I leak when I brush my teeth and could be the mint of the toothpaste.    Patient Stated Goals reduce her leakage.    Currently in Pain? No/denies    Multiple Pain Sites No                            Pelvic Floor Special Questions - 05/14/21 0001     Pelvic Floor  Internal Exam Patient confirmed identification and approves PT to assess pelvic floor and treatment    Exam Type Vaginal    Strength weak squeeze, no lift   good circular contraction with hug              [facility] Adult PT Treatment/Exercise - 05/14/21 0001       Lumbar Exercises: Aerobic   Nustep 5 minutes at level 4 while assessing patient      Manual Therapy   Manual Therapy Soft tissue mobilization;Internal Pelvic Floor    Soft tissue mobilization along the urogenital diaphragm and perineal body    Internal Pelvic Floor along the sides of the bladder with one hand external to work suprapubically; manaul work to the sides of the introitus                     PT Education - 05/14/21 0913     Education Details Access Code: [email or web address]    Person(s) Educated Patient    Methods Explanation;Demonstration;Handout    Comprehension Verbalized understanding;Returned demonstration              PT Short Term Goals - 05/14/21 0930       PT SHORT TERM  GOAL #1   Title independent with initial HEP for pelvic floor bulging and diaphragmatic breathing    Time 4    Period Weeks    Status On-going      PT SHORT TERM GOAL #2   Title able to breath with full excursion of his lower rib cage with reducing her rib angle    Time 4    Period Weeks    Status On-going      PT SHORT TERM GOAL #3   Title able to form a circular contraction with strength of pelvic floor >/= 3/5    Time 4    Period Weeks    Status On-going               PT Long Term Goals - 04/23/21 1548       PT LONG TERM GOAL #1   Title independent with advanced HEP for pelvic floor and core strength    Time 12    Period Weeks    Status New    [place] Date 07/16/21      PT LONG TERM GOAL #2   Title pelvic floor strength >/= 3/5 holding for 10 seconds so her urinary leakage decreased >/= 80%    Time 12    Period Weeks    Status New    [place] Date 07/16/21      PT LONG TERM GOAL #3   Title able  to quickly contract the pelvic floor to reduce leakage with coughing, laughing, and sneezig    Time 12    Period Weeks    Status New    [place] Date 07/16/21      PT LONG TERM GOAL #4   Title patient is wearing a pad for just in case not due to urinary leakage due to improved pelvic floor coordination and strength    Time 12    Period Weeks    Status New    [place] Date 07/16/21                   Plan - 05/14/21 0926     Clinical Impression Statement Patient feels her leakage is a little better. She is able to do a circular contraction and hug therapist finger compared to anterior posterior contraction. She has tigthness in the left side of the bladder. No tenderness in the posterior vaginal canal today. She is learning how to contract the obliques. Patient will benefit from skilled therapy to improve pelvic floor contraction and coordination to reduce her leakage.    Personal Factors and Comorbidities Fitness;Age;Past/Current Experience;Comorbidity 3+    Comorbidities Diabetes; h/o Melanoma on her back; osteopenia; Abdominal Hystorectomy 07/1998    Examination-Activity Limitations Continence    Rehab Potential Excellent    PT Frequency 1x / week    PT Duration 12 weeks    PT Treatment/Interventions ADLs/Self Care Home Management;Biofeedback;Therapeutic activities;Therapeutic exercise;Neuromuscular re-education;Patient/family education;Manual techniques;Dry needling    PT Next Visit Plan manual work to pelvic floor to work on circular contraction and to posterior vaginal wall; work around the bladder; progress abdominal contraction to add leg movements    PT Home Exercise Plan Access Code: [email or web address]    Consulted and Agree with Plan of Care Patient             Patient will benefit from skilled therapeutic intervention in order to improve the following deficits and impairments:  Decreased coordination, Decreased range of motion, Increased fascial restricitons, Decreased  endurance, Decreased activity tolerance, Decreased  strength  Visit Diagnosis: Muscle weakness (generalized)  Unspecified lack of coordination     Problem List Patient Active Problem List   Diagnosis Date Noted   Allergic rhinitis with a non-allergic component 11/26/2016   Allergic conjunctivitis 11/26/2016   Chronic cholecystitis with calculus 02/02/2015   Diabetes mellitus without complication (HCC)    GERD (gastroesophageal reflux disease)     Earlie Counts, PT 05/14/21 9:31 AM  [facility] @ [place], [place], [address] Phone: [phone]   Fax:  [phone]  Name: Margaret Bond MRN: [id number] Date of Birth: March 05, 1946

## 2021-05-15 ENCOUNTER — Ambulatory Visit
Admission: RE | Admit: 2021-05-15 | Discharge: 2021-05-15 | Disposition: A | Discharge status: Home / Self Care | Payer: Medicare Other | Source: Ambulatory Visit | Attending: Nephrology | Admitting: Nephrology

## 2021-05-15 DIAGNOSIS — N281 Cyst of kidney, acquired: Secondary | ICD-10-CM | POA: Diagnosis not present

## 2021-05-15 DIAGNOSIS — N189 Chronic kidney disease, unspecified: Secondary | ICD-10-CM | POA: Diagnosis not present

## 2021-05-15 DIAGNOSIS — N1832 Chronic kidney disease, stage 3b: Secondary | ICD-10-CM

## 2021-05-15 MED ORDER — GADOBENATE DIMEGLUMINE 529 MG/ML IV SOLN
13.0000 mL | Freq: Once | INTRAVENOUS | Status: AC | PRN
  Administered 2021-05-15: 13 mL via INTRAVENOUS

## 2021-05-21 ENCOUNTER — Other Ambulatory Visit: Payer: Self-pay

## 2021-05-21 ENCOUNTER — Ambulatory Visit: Payer: Medicare Other | Admitting: Physical Therapy

## 2021-05-21 ENCOUNTER — Encounter: Payer: Self-pay | Admitting: Physical Therapy

## 2021-05-21 DIAGNOSIS — M6281 Muscle weakness (generalized): Secondary | ICD-10-CM

## 2021-05-21 DIAGNOSIS — R279 Unspecified lack of coordination: Secondary | ICD-10-CM

## 2021-05-21 NOTE — Therapy (Signed)
[facility] @ [place], [place], [address] Phone: [phone]   Fax:  [phone]  Physical Therapy Treatment  Patient Details  Name: Margaret Bond MRN: [id number] Date of Birth: 1945-07-16 Referring Provider (PT): Dr. Cari Caraway   Encounter Date: 05/21/2021   PT End of Session - 05/21/21 1019     Visit Number 3    Date for PT Re-Evaluation 07/16/21    Authorization Type [place] - Visit Number 3    Authorization - Number of Visits 10    PT Start Time 4193    PT Stop Time 1053    PT Time Calculation (min) 38 min    Activity Tolerance Patient tolerated treatment well    Behavior During Therapy WFL for tasks assessed/performed             Past Medical History:  Diagnosis Date   Complication of anesthesia    itchy and hyper after anesthesia, nausea likes phenergan   Diabetes mellitus without complication (HCC)    Gallstones    GERD (gastroesophageal reflux disease)    Hypertension    PONV (postoperative nausea and vomiting)    Urticaria     Past Surgical History:  Procedure Laterality Date   ABDOMINAL HYSTERECTOMY     complete   colonscopy     ESOPHAGOGASTRODUODENOSCOPY ENDOSCOPY     LAPAROSCOPIC CHOLECYSTECTOMY SINGLE SITE WITH INTRAOPERATIVE CHOLANGIOGRAM N/A 02/02/2015   Procedure: LAPAROSCOPIC CHOLECYSTECTOMY SINGLE SITE WITH INTRAOPERATIVE CHOLANGIOGRAM;  Surgeon: Michael Boston, MD;  Location: [facility];  Service: General;  Laterality: N/A;    There were no vitals filed for this visit.   Subjective Assessment - 05/21/21 1020     Subjective I am not noticing the osor like I used to. I am thinking about my breathing when I am sitting. I am not leaking as much with brushing my teeth.    Patient Stated Goals reduce her leakage.    Currently in Pain? No/denies    Multiple Pain Sites No                               [facility] Adult PT  Treatment/Exercise - 05/21/21 0001       Lumbar Exercises: Seated   Other Seated Lumbar Exercises seated pelvic floor contraction holding 5 seconds 10x      Lumbar Exercises: Supine   Clam 10 reps;1 second;Other (comment)   right, left   Clam Limitations with red band around knees and pelvic floor contraction    Bent Knee Raise 10 reps;1 second   right, left   Bent Knee Raise Limitations abdominal and pelvic floor contraction    Bridge with [employer] 15 reps    Bridge with [employer] Limitations with pelvic floor contraction      Manual Therapy   Manual Therapy Soft tissue mobilization;Myofascial release    Manual therapy comments showed patient and she returned demonstration for scar massage on the lower abdomen    Soft tissue mobilization to lower abdomen working on hysterectomy scar    Myofascial Release fascial release along the umbilicus and suprapubically                     PT Education - 05/21/21 1036     Education Details Access Code: [email or web address]    Person(s) Educated Patient    Methods Explanation;Demonstration;Verbal cues;Handout    Comprehension  Returned demonstration;Verbalized understanding              PT Short Term Goals - 05/21/21 1057       PT SHORT TERM GOAL #1   Title independent with initial HEP for pelvic floor bulging and diaphragmatic breathing    Time 4    Period Weeks    Status Achieved      PT SHORT TERM GOAL #2   Title able to breath with full excursion of his lower rib cage with reducing her rib angle    Time 4    Period Weeks    Status Achieved      PT SHORT TERM GOAL #3   Title able to form a circular contraction with strength of pelvic floor >/= 3/5    Baseline circular but no lift, 25    Time 4    Period Weeks    Status On-going    Target Date 05/21/21               PT Long Term Goals - 04/23/21 1548       PT LONG TERM GOAL #1   Title independent with advanced HEP for pelvic floor and core strength     Time 12    Period Weeks    Status New    [place] Date 07/16/21      PT LONG TERM GOAL #2   Title pelvic floor strength >/= 3/5 holding for 10 seconds so her urinary leakage decreased >/= 80%    Time 12    Period Weeks    Status New    Target Date 07/16/21      PT LONG TERM GOAL #3   Title able to quickly contract the pelvic floor to reduce leakage with coughing, laughing, and sneezig    Time 12    Period Weeks    Status New    Target Date 07/16/21      PT LONG TERM GOAL #4   Title patient is wearing a pad for just in case not due to urinary leakage due to improved pelvic floor coordination and strength    Time 12    Period Weeks    Status New    Target Date 07/16/21                   Plan - 05/21/21 1052     Clinical Impression Statement Patient reports her urinary leakage is 35% better. She is able to contract her lower abdominals with greater ease. Patient needs work on contracting her obliques. Patient still has some tissue restrictions of the lower abdomen. Patient will benefit from skilled therapy to improve pelvic floor contraction and coordination to reduce her leakage.    Personal Factors and Comorbidities Fitness;Age;Past/Current Experience;Comorbidity 3+    Comorbidities Diabetes; h/o Melanoma on her back; osteopenia; Abdominal Hystorectomy 07/1998    Examination-Activity Limitations Continence    Stability/Clinical Decision Making Evolving/Moderate complexity    Rehab Potential Excellent    PT Frequency 1x / week    PT Duration 12 weeks    PT Treatment/Interventions ADLs/Self Care Home Management;Biofeedback;Therapeutic activities;Therapeutic exercise;Neuromuscular re-education;Patient/family education;Manual techniques;Dry needling    PT Next Visit Plan manual work to pelvic floor to work on circular contraction and to posterior vaginal wall; work around the bladder; manual work to the hysterectomy scar; review HEP    PT Home Exercise Plan Access Code:  [email or web address]    Consulted and Agree with Plan of Care Patient  Patient will benefit from skilled therapeutic intervention in order to improve the following deficits and impairments:  Decreased coordination, Decreased range of motion, Increased fascial restricitons, Decreased endurance, Decreased activity tolerance, Decreased strength  Visit Diagnosis: Muscle weakness (generalized)  Unspecified lack of coordination     Problem List Patient Active Problem List   Diagnosis Date Noted   Allergic rhinitis with a non-allergic component 11/26/2016   Allergic conjunctivitis 11/26/2016   Chronic cholecystitis with calculus 02/02/2015   Diabetes mellitus without complication (HCC)    GERD (gastroesophageal reflux disease)     Earlie Counts, PT 05/21/21 10:59 AM  [facility] @ [place], [place], [address] Phone: [phone]   Fax:  [phone]  Name: Asiyah Pineau MRN: [id number] Date of Birth: May 08, 1945

## 2021-05-21 NOTE — Patient Instructions (Signed)
Access Code: [email or web address] URL: [email or web address] Date: 05/21/2021 Prepared by: Earlie Counts  Exercises Hooklying Transversus Abdominis Palpation - 1 x daily - 3 x weekly - 2 sets - 10 reps Supine Bridge with Mini Swiss Ball Between Knees - 1 x daily - 3 x weekly - 1 sets - 10 reps Supine March with Resistance Band - 1 x daily - 3 x weekly - 3 sets - 10 reps Hooklying Clamshell with Resistance - 1 x daily - 3 x weekly - 2 sets - 10 reps Seated Pelvic Floor Contraction - 2 x daily - 7 x weekly - 1 sets - 10 reps - 5 sec hold [facility] [address] [place], [place] [address] Phone # [phone] Fax [phone]

## 2021-05-25 DIAGNOSIS — N281 Cyst of kidney, acquired: Secondary | ICD-10-CM | POA: Diagnosis not present

## 2021-05-25 DIAGNOSIS — E1122 Type 2 diabetes mellitus with diabetic chronic kidney disease: Secondary | ICD-10-CM | POA: Diagnosis not present

## 2021-05-25 DIAGNOSIS — I129 Hypertensive chronic kidney disease with stage 1 through stage 4 chronic kidney disease, or unspecified chronic kidney disease: Secondary | ICD-10-CM | POA: Diagnosis not present

## 2021-05-25 DIAGNOSIS — E785 Hyperlipidemia, unspecified: Secondary | ICD-10-CM | POA: Diagnosis not present

## 2021-05-25 DIAGNOSIS — N1832 Chronic kidney disease, stage 3b: Secondary | ICD-10-CM | POA: Diagnosis not present

## 2021-05-28 ENCOUNTER — Ambulatory Visit: Payer: Medicare Other | Admitting: Physical Therapy

## 2021-05-28 ENCOUNTER — Encounter: Payer: Self-pay | Admitting: Physical Therapy

## 2021-05-28 ENCOUNTER — Other Ambulatory Visit: Payer: Self-pay

## 2021-05-28 DIAGNOSIS — M6281 Muscle weakness (generalized): Secondary | ICD-10-CM

## 2021-05-28 DIAGNOSIS — R279 Unspecified lack of coordination: Secondary | ICD-10-CM

## 2021-05-28 NOTE — Therapy (Signed)
[facility] @ [facility] [place], [place], [address] Phone: [phone]   Fax:  [phone]  Physical Therapy Treatment  Patient Details  Name: Margaret Bond MRN: [id number] Date of Birth: [birth year] Referring Provider (PT): Dr. Cari Caraway   Encounter Date: 05/28/2021   PT End of Session - 05/28/21 1054     Visit Number 4    Date for PT Re-Evaluation 07/16/21    Authorization Type [employer] - Visit Number 4    Authorization - Number of Visits 10    PT Start Time 6378    PT Stop Time 1055    PT Time Calculation (min) 40 min    Activity Tolerance Patient tolerated treatment well    Behavior During Therapy WFL for tasks assessed/performed             Past Medical History:  Diagnosis Date   Complication of anesthesia    itchy and hyper after anesthesia, nausea likes phenergan   Diabetes mellitus without complication (HCC)    Gallstones    GERD (gastroesophageal reflux disease)    Hypertension    PONV (postoperative nausea and vomiting)    Urticaria     Past Surgical History:  Procedure Laterality Date   ABDOMINAL HYSTERECTOMY     complete   colonscopy     ESOPHAGOGASTRODUODENOSCOPY ENDOSCOPY     LAPAROSCOPIC CHOLECYSTECTOMY SINGLE SITE WITH INTRAOPERATIVE CHOLANGIOGRAM N/A 02/02/2015   Procedure: LAPAROSCOPIC CHOLECYSTECTOMY SINGLE SITE WITH INTRAOPERATIVE CHOLANGIOGRAM;  Surgeon: Michael Boston, MD;  Location: [facility];  Service: General;  Laterality: N/A;    There were no vitals filed for this visit.   Subjective Assessment - 05/28/21 1018     Subjective I have not noticed changes and was not able to get all of the exercises done. Patient reports her urinary leakage is 35% better since initial evaluation.    Patient Stated Goals reduce her leakage.    Currently in Pain? No/denies    Multiple Pain Sites No                               [facility] Adult PT  Treatment/Exercise - 05/28/21 0001       Lumbar Exercises: Supine   Ab Set 10 reps;1 second    AB Set Limitations with ball squeeze    Clam 10 reps;1 second;Other (comment)   right, left   Clam Limitations with red band around knees and pelvic floor contraction    Bridge with [employer] 10 reps;1 second    Bridge with [employer] Limitations with pelvic floor contraction      Lumbar Exercises: Sidelying   Other Sidelying Lumbar Exercises sidely with towel squeeze contracting the pelvic floor and abdominals while pushing into ball      Manual Therapy   Manual Therapy Soft tissue mobilization;Myofascial release    Soft tissue mobilization circular massage around the abdomen to improve tissue mobility    Myofascial Release fascial release along the umbilicus and suprapubically; along the pubovesical ligament, alon gthe midline of the abdomen                       PT Short Term Goals - 05/21/21 1057       PT SHORT TERM GOAL #1   Title independent with initial HEP for pelvic floor bulging and diaphragmatic breathing    Time 4  Period Weeks    Status Achieved      PT SHORT TERM GOAL #2   Title able to breath with full excursion of his lower rib cage with reducing her rib angle    Time 4    Period Weeks    Status Achieved      PT SHORT TERM GOAL #3   Title able to form a circular contraction with strength of pelvic floor >/= 3/5    Baseline circular but no lift, 25    Time 4    Period Weeks    Status On-going    Target Date 05/21/21               PT Long Term Goals - 05/28/21 1019       PT LONG TERM GOAL #1   Title independent with advanced HEP for pelvic floor and core strength    Time 12    Period Weeks    Status On-going      PT LONG TERM GOAL #2   Title pelvic floor strength >/= 3/5 holding for 10 seconds so her urinary leakage decreased >/= 80%    Time 12    Period Weeks    Status On-going      PT LONG TERM GOAL #3   Title able to  quickly contract the pelvic floor to reduce leakage with coughing, laughing, and sneezig    Time 12    Period Weeks    Status On-going      PT LONG TERM GOAL #4   Title patient is wearing a pad for just in case not due to urinary leakage due to improved pelvic floor coordination and strength    Time 12    Period Weeks    Status On-going                   Plan - 05/28/21 1032     Clinical Impression Statement Patient feels the manual work on her abdomen is better than the manual work to the pelvic floor. She has improve tissue mobility in the abdomen. Patient reports her urinary leakage is 35% better. She is able to contract her upper abdominal better to reduce the rib angle. Patient feels the pelvic floor contract more with clam and marching with resistance. Patient will benefit from skilled therapy to improve pelvic floor contraction and coordination to reduce her leakage.    Personal Factors and Comorbidities Fitness;Age;Past/Current Experience;Comorbidity 3+    Comorbidities Diabetes; h/o Melanoma on her back; osteopenia; Abdominal Hystorectomy 07/1998    Examination-Activity Limitations Continence    Stability/Clinical Decision Making Evolving/Moderate complexity    Rehab Potential Excellent    PT Frequency 1x / week    PT Duration 12 weeks    PT Treatment/Interventions ADLs/Self Care Home Management;Biofeedback;Therapeutic activities;Therapeutic exercise;Neuromuscular re-education;Patient/family education;Manual techniques;Dry needling    PT Next Visit Plan quick flicks with cough, sitting contract with leaning forward, contract with pulling on band in standing with pelvic floor contraction    PT Home Exercise Plan Access Code: [email or web address]    Consulted and Agree with Plan of Care Patient             Patient will benefit from skilled therapeutic intervention in order to improve the following deficits and impairments:  Decreased coordination, Decreased range of motion,  Increased fascial restricitons, Decreased endurance, Decreased activity tolerance, Decreased strength  Visit Diagnosis: Muscle weakness (generalized)  Unspecified lack of coordination     Problem List Patient Active Problem  List   Diagnosis Date Noted   Allergic rhinitis with a non-allergic component 11/26/2016   Allergic conjunctivitis 11/26/2016   Chronic cholecystitis with calculus 02/02/2015   Diabetes mellitus without complication (HCC)    GERD (gastroesophageal reflux disease)     Earlie Counts, PT 05/28/21 10:54 AM  [facility] @ [facility], [place], [address] Phone: [phone]   Fax:  [phone]  Name: Margaret Bond MRN: [id number] Date of Birth: [birth year]

## 2021-06-04 ENCOUNTER — Encounter: Payer: Self-pay | Admitting: Physical Therapy

## 2021-06-04 ENCOUNTER — Ambulatory Visit: Payer: Medicare Other | Attending: Family Medicine | Admitting: Physical Therapy

## 2021-06-04 ENCOUNTER — Other Ambulatory Visit: Payer: Self-pay

## 2021-06-04 DIAGNOSIS — M6281 Muscle weakness (generalized): Secondary | ICD-10-CM

## 2021-06-04 DIAGNOSIS — R279 Unspecified lack of coordination: Secondary | ICD-10-CM | POA: Diagnosis not present

## 2021-06-04 NOTE — Patient Instructions (Signed)
Access Code: [email or web address] ?URL: [email or web address] ?Date: 06/04/2021 ?Prepared by: Earlie Counts ? ?Exercises ?Seated Pelvic Floor Contraction - 2 x daily - 7 x weekly - 1 sets - 10 reps - 8 sec hold ?Seated Quick Flick Pelvic Floor Contractions - 2 x daily - 7 x weekly - 1 sets - 5 reps ?Standing Anti-Rotation Press with Anchored Resistance - 1 x daily - 3 x weekly - 2 sets - 10 reps ?Standing Alternating Shoulder Extension with Resistance - 1 x daily - 3 x weekly - 2 sets - 10 reps ?Doorway Pec Stretch at 90 Degrees Abduction - 1 x daily - 7 x weekly - 1 sets - 2 reps - 30 sec hold ?[facility] ?[address], [address] ?[place], [place] [address] ?Phone # [phone] ?Fax [phone] ? ?

## 2021-06-04 NOTE — Therapy (Signed)
[facility] ?[facility] [place] ?[place], [place], [address] ?Phone: [phone]   Fax:  [phone] ? ?Physical Therapy Treatment ? ?Patient Details  ?Name: Margaret Bond ?MRN: [id number] ?Date of Birth: May 31, 1945 ?Referring Provider (PT): Dr. Cari Caraway ? ? ?Encounter Date: 06/04/2021 ? ? PT End of Session - 06/04/21 1101   ? ? Visit Number 5   ? Date for PT Re-Evaluation 07/16/21   ? Authorization Type [employer] medicare   ? Authorization - Visit Number 5   ? Authorization - Number of Visits 10   ? PT Start Time 1015   ? PT Stop Time 1055   ? PT Time Calculation (min) 40 min   ? Activity Tolerance Patient tolerated treatment well   ? Behavior During Therapy [facility] for tasks assessed/performed   ? ?  ?  ? ?  ? ? ?Past Medical History:  ?Diagnosis Date  ? Complication of anesthesia   ? itchy and hyper after anesthesia, nausea likes phenergan  ? Diabetes mellitus without complication ([facility] [facility])   ? Gallstones   ? GERD (gastroesophageal reflux disease)   ? Hypertension   ? PONV (postoperative nausea and vomiting)   ? Urticaria   ? ? ?Past Surgical History:  ?Procedure Laterality Date  ? ABDOMINAL HYSTERECTOMY    ? complete  ? colonscopy    ? ESOPHAGOGASTRODUODENOSCOPY ENDOSCOPY    ? LAPAROSCOPIC CHOLECYSTECTOMY SINGLE SITE WITH INTRAOPERATIVE CHOLANGIOGRAM N/A 02/02/2015  ? Procedure: LAPAROSCOPIC CHOLECYSTECTOMY SINGLE SITE WITH INTRAOPERATIVE CHOLANGIOGRAM;  Surgeon: Michael Boston, MD;  Location: [facility];  Service: General;  Laterality: N/A;  ? ? ?There were no vitals filed for this visit. ? ? Subjective Assessment - 06/04/21 1017   ? ? Subjective Leakage is still improving. I only got up 1 time in the middle of the night.   ? Patient Stated Goals reduce her leakage.   ? Currently in Pain? No/denies   ? Multiple Pain Sites No   ? ?  ?  ? ?  ? ? ? ? ? ? ? ? ? ? ? ? ? ? ? ? ? Pelvic Floor Special Questions - 06/04/21 0001   ? ? Pad use 1 mini pad   ? ?  ?  ? ?  ? ? ? ? [facility]  Adult PT Treatment/Exercise - 06/04/21 0001   ? ?  ? Lumbar Exercises: Aerobic  ? Nustep level 4 for 5 minutes while assessing patient   ?  ? Lumbar Exercises: Standing  ? Other Standing Lumbar Exercises standing pallof red band 10x right side and left side   ? Other Standing Lumbar Exercises standing alternating shoulder extension with pelvic floor and abdominal contraction   ?  ? Lumbar Exercises: Seated  ? Other Seated Lumbar Exercises seated pelvic floor contraction holding 8 seconds 10x ending with 5 quick flicks.   ? Other Seated Lumbar Exercises seated contract pelvic floor than lean forward without wt. 10x then with 4# 10x   ?  ? Knee/Hip Exercises: Standing  ? Lateral Step Up Right;Left;1 set;10 reps;Hand Hold: 1;Step Height: 4"   ? Lateral Step Up Limitations with pelvic floor contraction   ? Other Standing Knee Exercises standing dead lift 5#10x then 10# with pelvic floor contraction and hip hinging   ? Other Standing Knee Exercises hip hinging 10x each side   ? ?  ?  ? ?  ? ? ? ? ? ? ? ? ? ? PT Education - 06/04/21  1059   ? ? Education Details Access Code: [email or web address]   ? Person(s) Educated Patient   ? Methods Explanation;Demonstration;Verbal cues;Handout   ? Comprehension Returned demonstration;Verbalized understanding   ? ?  ?  ? ?  ? ? ? PT Short Term Goals - 05/21/21 1057   ? ?  ? PT SHORT TERM GOAL #1  ? Title independent with initial HEP for pelvic floor bulging and diaphragmatic breathing   ? Time 4   ? Period Weeks   ? Status Achieved   ?  ? PT SHORT TERM GOAL #2  ? Title able to breath with full excursion of his lower rib cage with reducing her rib angle   ? Time 4   ? Period Weeks   ? Status Achieved   ?  ? PT SHORT TERM GOAL #3  ? Title able to form a circular contraction with strength of pelvic floor >/= 3/5   ? Baseline circular but no lift, 25   ? Time 4   ? Period Weeks   ? Status On-going   ? Target Date 05/21/21   ? ?  ?  ? ?  ? ? ? ? PT Long Term Goals - 06/04/21 1101   ? ?  ? PT LONG TERM  GOAL #1  ? Title independent with advanced HEP for pelvic floor and core strength   ? Time 12   ? Period Weeks   ? Status On-going   ?  ? PT LONG TERM GOAL #2  ? Title pelvic floor strength >/= 3/5 holding for 10 seconds so her urinary leakage decreased >/= 80%   ? Time 12   ? Period Weeks   ? Status On-going   ?  ? PT LONG TERM GOAL #3  ? Title able to quickly contract the pelvic floor to reduce leakage with coughing, laughing, and sneezig   ? Time 12   ? Period Weeks   ? Status On-going   ?  ? PT LONG TERM GOAL #4  ? Title patient is wearing a pad for just in case not due to urinary leakage due to improved pelvic floor coordination and strength   ? Time 12   ? Period Weeks   ? Status Achieved   ? ?  ?  ? ?  ? ? ? ? ? ? ? ? Plan - 06/04/21 1015   ? ? Clinical Impression Statement Patient is only getting up 1 time instead of 2-3. Patient is wearing one mini pad instead of 2. She is able to contract the pelvic floor for 8 seconds instead of 5. Patietn has learned to do quick flicks to contract the pelvic floor for coughing and sneezing. Patient will benefit from skilled therapy to improve pelvic floor contraction and coordination to reduce her leakage.   ? Personal Factors and Comorbidities Fitness;Age;Past/Current Experience;Comorbidity 3+   ? Comorbidities Diabetes; h/o Melanoma on her back; osteopenia; Abdominal Hystorectomy 07/1998   ? Examination-Activity Limitations Continence   ? Stability/Clinical Decision Making Evolving/Moderate complexity   ? Rehab Potential Excellent   ? PT Frequency 1x / week   ? PT Duration 12 weeks   ? PT Treatment/Interventions ADLs/Self Care Home Management;Biofeedback;Therapeutic activities;Therapeutic exercise;Neuromuscular re-education;Patient/family education;Manual techniques;Dry needling   ? PT Next Visit Plan update HEP to add the standing exercises that were done last session   ? PT Home Exercise Plan Access Code: [email or web address]   ? Consulted and Agree with Plan of Care Patient    ? ?  ?  ? ?  ? ? ?  Patient will benefit from skilled therapeutic intervention in order to improve the following deficits and impairments:  Decreased coordination, Decreased range of motion, Increased fascial restricitons, Decreased endurance, Decreased activity tolerance, Decreased strength ? ?Visit Diagnosis: ?Muscle weakness (generalized) ? ?Unspecified lack of coordination ? ? ? ? ?Problem List ?Patient Active Problem List  ? Diagnosis Date Noted  ? Allergic rhinitis with a non-allergic component 11/26/2016  ? Allergic conjunctivitis 11/26/2016  ? Chronic cholecystitis with calculus 02/02/2015  ? Diabetes mellitus without complication ([facility])   ? GERD (gastroesophageal reflux disease)   ? ? ?Earlie Counts, PT ?06/04/21 11:02 AM ? ?[facility] ?[facility] ?[place], [place], [address] ?Phone: [phone]   Fax:  [phone] ? ?Name: Margaret Bond ?MRN: [id number] ?Date of Birth: Nov 06, 1945 ? ? ? ?

## 2021-06-07 DIAGNOSIS — E1165 Type 2 diabetes mellitus with hyperglycemia: Secondary | ICD-10-CM | POA: Diagnosis not present

## 2021-06-13 ENCOUNTER — Other Ambulatory Visit: Payer: Self-pay

## 2021-06-13 ENCOUNTER — Ambulatory Visit: Payer: Medicare Other | Admitting: Physical Therapy

## 2021-06-13 ENCOUNTER — Encounter: Payer: Self-pay | Admitting: Physical Therapy

## 2021-06-13 DIAGNOSIS — R279 Unspecified lack of coordination: Secondary | ICD-10-CM

## 2021-06-13 DIAGNOSIS — M6281 Muscle weakness (generalized): Secondary | ICD-10-CM | POA: Diagnosis not present

## 2021-06-13 NOTE — Therapy (Signed)
[facility] ?[facility] ?[place], [place], [address] ?Phone: [phone]   Fax:  [phone] ? ?Physical Therapy Treatment ? ?Patient Details  ?Name: Margaret Bond ?MRN: [id number] ?Date of Birth: 10/02/1945 ?Referring Provider (PT): Dr. Cari Caraway ? ? ?Encounter Date: 06/13/2021 ? ? PT End of Session - 06/13/21 1019   ? ? Visit Number 6   ? Date for PT Re-Evaluation 07/16/21   ? Authorization Type [employer] medicare   ? Authorization - Visit Number 6   ? Authorization - Number of Visits 10   ? PT Start Time 1015   ? PT Stop Time 1055   ? PT Time Calculation (min) 40 min   ? Activity Tolerance Patient tolerated treatment well   ? Behavior During Therapy [facility] for tasks assessed/performed   ? ?  ?  ? ?  ? ? ?Past Medical History:  ?Diagnosis Date  ? Complication of anesthesia   ? itchy and hyper after anesthesia, nausea likes phenergan  ? Diabetes mellitus without complication ([place])   ? Gallstones   ? GERD (gastroesophageal reflux disease)   ? Hypertension   ? PONV (postoperative nausea and vomiting)   ? Urticaria   ? ? ?Past Surgical History:  ?Procedure Laterality Date  ? ABDOMINAL HYSTERECTOMY    ? complete  ? colonscopy    ? ESOPHAGOGASTRODUODENOSCOPY ENDOSCOPY    ? LAPAROSCOPIC CHOLECYSTECTOMY SINGLE SITE WITH INTRAOPERATIVE CHOLANGIOGRAM N/A 02/02/2015  ? Procedure: LAPAROSCOPIC CHOLECYSTECTOMY SINGLE SITE WITH INTRAOPERATIVE CHOLANGIOGRAM;  Surgeon: Michael Boston, MD;  Location: [facility];  Service: General;  Laterality: N/A;  ? ? ?There were no vitals filed for this visit. ? ? Subjective Assessment - 06/13/21 1019   ? ? Subjective Urinary leakage is 60% better.   ? Patient Stated Goals reduce her leakage.   ? Currently in Pain? No/denies   ? Multiple Pain Sites No   ? ?  ?  ? ?  ? ? ? ? ? ? ? ? ? ? ? ? ? ? ? ? ? Pelvic Floor Special Questions - 06/13/21 0001   ? ? Exam Type Deferred   ? ?  ?  ? ?  ? ? ? ? [facility] Adult PT Treatment/Exercise - 06/13/21 0001    ? ?  ? Lumbar Exercises: Aerobic  ? Nustep level 5 for 5 minutes while assessing patient   ?  ? Lumbar Exercises: Standing  ? Other Standing Lumbar Exercises standing pallof red band 10x right side and left side   ? Other Standing Lumbar Exercises standing alternating shoulder extension with pelvic floor and abdominal contraction   ?  ? Lumbar Exercises: Supine  ? Clam 10 reps;1 second   feet on bolster, green loop around knees  ? Clam Limitations contract the abdomen and pelvic floor   ? Bent Knee Raise 20 reps;1 second   alternate legs, feet on bolster, green band around the wrists  ? Bent Knee Raise Limitations shoulders at 90 degrees flexion, contract thelower abdomen and pelvic floro   ? Bridge 10 reps   ? Bridge Limitations feet on bolster and contract pelvci floot   ? Other Supine Lumbar Exercises supine with one leg at 90/90, other on bolster then alternate shoulder flexion with green band around wrists 10x each leg   ?  ? Knee/Hip Exercises: Standing  ? Lateral Step Up Right;Left;1 set;10 reps;Hand Hold: 1;Step Height: 4"   ? Lateral Step Up Limitations with pelvic floor contraction   ?  Other Standing Knee Exercises standing dead lift  10# with pelvic floor contraction and hip hinging and verbal cues to keep scapula retracted   ? ?  ?  ? ?  ? ? ? ? ? ? ? ? ? ? PT Education - 06/13/21 1056   ? ? Education Details Access Code: [email or web address]   ? Person(s) Educated Patient   ? Methods Explanation;Demonstration;Verbal cues;Handout   ? Comprehension Returned demonstration;Verbalized understanding   ? ?  ?  ? ?  ? ? ? PT Short Term Goals - 06/13/21 1020   ? ?  ? PT SHORT TERM GOAL #1  ? Title independent with initial HEP for pelvic floor bulging and diaphragmatic breathing   ? Time 4   ? Period Weeks   ? Status Achieved   ? Target Date 05/21/21   ?  ? PT SHORT TERM GOAL #2  ? Title able to breath with full excursion of his lower rib cage with reducing her rib angle   ? Time 4   ? Period Weeks   ? Status Achieved   ?  Target Date 05/21/21   ?  ? PT SHORT TERM GOAL #3  ? Title able to form a circular contraction with strength of pelvic floor >/= 3/5   ? Baseline circular but no lift, 25   ? Time 4   ? Period Weeks   ? Status On-going   ? Target Date 05/21/21   ? ?  ?  ? ?  ? ? ? ? PT Long Term Goals - 06/13/21 1020   ? ?  ? PT LONG TERM GOAL #1  ? Title independent with advanced HEP for pelvic floor and core strength   ? Time 12   ? Period Weeks   ? Status On-going   ?  ? PT LONG TERM GOAL #2  ? Title pelvic floor strength >/= 3/5 holding for 10 seconds so her urinary leakage decreased >/= 80%   ? Baseline 60% better   ? Time 12   ? Period Weeks   ? Status On-going   ? Target Date 07/16/21   ?  ? PT LONG TERM GOAL #3  ? Title able to quickly contract the pelvic floor to reduce leakage with coughing, laughing, and sneezing   ? Baseline no leakage with sneezing, coughing, laughing   ? Time 12   ? Period Weeks   ? Status Achieved   ?  ? PT LONG TERM GOAL #4  ? Title patient is wearing a pad for just in case not due to urinary leakage due to improved pelvic floor coordination and strength   ? Time 12   ? Period Weeks   ? Status Achieved   ? Target Date 07/16/21   ? ?  ?  ? ?  ? ? ? ? ? ? ? ? Plan - 06/13/21 1057   ? ? Clinical Impression Statement Patient reports her leakage is 60% better. She is not leaking with coughing, laughing, and sneezing She leaks at rest. Patients pads are dryer. She is working on 10 second contraction compared to 8 seconds. Patient is able to engage her abdominals better. She deferred internal work to the pelvic floor due to not being comfortable. Patient will benefit from skilled therapy to improve pelvic floor contraction and coordination to reduce leakage.   ? Personal Factors and Comorbidities Fitness;Age;Past/Current Experience;Comorbidity 3+   ? Comorbidities Diabetes; h/o Melanoma on her back; osteopenia; Abdominal Hystorectomy 07/1998   ?  Examination-Activity Limitations Continence   ?  Stability/Clinical Decision Making Evolving/Moderate complexity   ? Rehab Potential Excellent   ? PT Frequency 1x / week   ? PT Duration 12 weeks   ? PT Treatment/Interventions ADLs/Self Care Home Management;Biofeedback;Therapeutic activities;Therapeutic exercise;Neuromuscular re-education;Patient/family education;Manual techniques;Dry needling   ? PT Next Visit Plan continues iwth nustep, working on advanced supine core strength, add one legged activity   ? PT Home Exercise Plan Access Code: [email or web address]   ? Consulted and Agree with Plan of Care Patient   ? ?  ?  ? ?  ? ? ?Patient will benefit from skilled therapeutic intervention in order to improve the following deficits and impairments:  Decreased coordination, Decreased range of motion, Increased fascial restricitons, Decreased endurance, Decreased activity tolerance, Decreased strength ? ?Visit Diagnosis: ?Muscle weakness (generalized) ? ?Unspecified lack of coordination ? ? ? ? ?Problem List ?Patient Active Problem List  ? Diagnosis Date Noted  ? Allergic rhinitis with a non-allergic component 11/26/2016  ? Allergic conjunctivitis 11/26/2016  ? Chronic cholecystitis with calculus 02/02/2015  ? Diabetes mellitus without complication ([facility])   ? GERD (gastroesophageal reflux disease)   ? ? ?Earlie Counts, PT ?06/13/21 11:03 AM ? ? ?[facility] @ [place] ?[place], [place], [address] ?Phone: [phone]   Fax:  [phone] ? ?Name: Margaret Bond ?MRN: [id number] ?Date of Birth: 03/27/1946 ? ? ? ?

## 2021-06-13 NOTE — Patient Instructions (Signed)
Access Code: [email or web address] ?URL: [email or web address] ?Date: 06/13/2021 ?Prepared by: Earlie Counts ? ?Exercises ?Hooklying Transversus Abdominis Palpation - 1 x daily - 3 x weekly - 2 sets - 10 reps ?Supine Bridge with Mini Swiss Ball Between Knees - 1 x daily - 3 x weekly - 1 sets - 10 reps ?Supine March with Resistance Band - 1 x daily - 3 x weekly - 3 sets - 10 reps ?Hooklying Clamshell with Resistance - 1 x daily - 3 x weekly - 2 sets - 10 reps ?Seated Pelvic Floor Contraction - 2 x daily - 7 x weekly - 1 sets - 10 reps - 10 sec hold ?Seated Quick Flick Pelvic Floor Contractions - 2 x daily - 7 x weekly - 1 sets - 5 reps ?Standing Anti-Rotation Press with Anchored Resistance - 1 x daily - 3 x weekly - 2 sets - 10 reps ?Standing Alternating Shoulder Extension with Resistance - 1 x daily - 3 x weekly - 2 sets - 10 reps ?Doorway Pec Stretch at 90 Degrees Abduction - 1 x daily - 3 x weekly - 1 sets - 2 reps - 30 sec hold ?Lateral Step Ups - 1 x daily - 3 x weekly - 2 sets - 10 reps ?[facility] ?[address], [address] ?[place],  [address] ?Phone # [phone] ?Fax [phone] ? ?

## 2021-06-18 ENCOUNTER — Encounter: Payer: Self-pay | Admitting: Physical Therapy

## 2021-06-18 ENCOUNTER — Other Ambulatory Visit: Payer: Self-pay

## 2021-06-18 ENCOUNTER — Ambulatory Visit: Payer: Medicare Other | Admitting: Physical Therapy

## 2021-06-18 DIAGNOSIS — M6281 Muscle weakness (generalized): Secondary | ICD-10-CM | POA: Diagnosis not present

## 2021-06-18 DIAGNOSIS — R279 Unspecified lack of coordination: Secondary | ICD-10-CM | POA: Diagnosis not present

## 2021-06-18 NOTE — Therapy (Signed)
[facility] ?[facility] ?[place], [place], [address] ?Phone: [phone]   Fax:  [phone] ? ?Physical Therapy Treatment ? ?Patient Details  ?Name: Margaret Bond ?MRN: [id number] ?Date of Birth: 06/29/1945 ?Referring Provider (PT): Dr. Cari Caraway ? ? ?Encounter Date: 06/18/2021 ? ? PT End of Session - 06/18/21 1023   ? ? Visit Number 7   ? Date for PT Re-Evaluation 07/16/21   ? Authorization Type UHC medicare   ? Authorization - Visit Number 7   ? Authorization - Number of Visits 10   ? PT Start Time 1015   ? PT Stop Time 1055   ? PT Time Calculation (min) 40 min   ? Activity Tolerance Patient tolerated treatment well   ? Behavior During Therapy [facility] for tasks assessed/performed   ? ?  ?  ? ?  ? ? ?Past Medical History:  ?Diagnosis Date  ? Complication of anesthesia   ? itchy and hyper after anesthesia, nausea likes phenergan  ? Diabetes mellitus without complication ([place])   ? Gallstones   ? GERD (gastroesophageal reflux disease)   ? Hypertension   ? PONV (postoperative nausea and vomiting)   ? Urticaria   ? ? ?Past Surgical History:  ?Procedure Laterality Date  ? ABDOMINAL HYSTERECTOMY    ? complete  ? colonscopy    ? ESOPHAGOGASTRODUODENOSCOPY ENDOSCOPY    ? LAPAROSCOPIC CHOLECYSTECTOMY SINGLE SITE WITH INTRAOPERATIVE CHOLANGIOGRAM N/A 02/02/2015  ? Procedure: LAPAROSCOPIC CHOLECYSTECTOMY SINGLE SITE WITH INTRAOPERATIVE CHOLANGIOGRAM;  Surgeon: Michael Boston, MD;  Location: [facility];  Service: General;  Laterality: N/A;  ? ? ?There were no vitals filed for this visit. ? ? Subjective Assessment - 06/18/21 1052   ? ? Subjective no changes this week due to not being able to do my exercises.   ? Patient Stated Goals reduce her leakage.   ? Currently in Pain? No/denies   ? Multiple Pain Sites No   ? ?  ?  ? ?  ? ? ? ? ? ? ? ? ? ? ? ? ? ? ? ? ? ? ? ? [facility] Adult PT Treatment/Exercise - 06/18/21 0001   ? ?  ? Lumbar Exercises: Aerobic  ? Nustep level 5 for 6  minutes while assessing patient   ?  ? Lumbar Exercises: Standing  ? Other Standing Lumbar Exercises standing pallof red band 10x right side and left side standing on foam and feet together   ? Other Standing Lumbar Exercises standing alternating shoulder extension with pelvic floor and abdominal contraction using the red band, then do the same with in tandem stance   ?  ? Lumbar Exercises: Supine  ? Clam 10 reps;1 second   feet on bolster, blue loop around knees  ? Clam Limitations contract the abdomen and pelvic floor   ? Bent Knee Raise 20 reps;1 second   alternate legs, feet on bolster, green band around the wrists  ? Bent Knee Raise Limitations shoulders at 90 degrees flexion, contract thelower abdomen and pelvic floro   ? Bridge with clamshell 15 reps;Non-compliant;1 second   blue loop around knees; feet on bolster  ? Bridge with [employer] Limitations Pelvic floor contraction   ? Other Supine Lumbar Exercises supine with one leg at 90/90, other on bolster then alternate shoulder flexion with green band around wrists 10x each leg   ?  ? Knee/Hip Exercises: Standing  ? Lateral Step Up Right;Left;1 set;10 reps;Hand Hold: 1;Step Height: 4"   ?  Lateral Step Up Limitations with pelvic floor contraction on the round side of BOSU ball   ? ?  ?  ? ?  ? ? ? ? ? ? ? ? ? ? ? ? PT Short Term Goals - 06/13/21 1020   ? ?  ? PT SHORT TERM GOAL #1  ? Title independent with initial HEP for pelvic floor bulging and diaphragmatic breathing   ? Time 4   ? Period Weeks   ? Status Achieved   ? Target Date 05/21/21   ?  ? PT SHORT TERM GOAL #2  ? Title able to breath with full excursion of his lower rib cage with reducing her rib angle   ? Time 4   ? Period Weeks   ? Status Achieved   ? Target Date 05/21/21   ?  ? PT SHORT TERM GOAL #3  ? Title able to form a circular contraction with strength of pelvic floor >/= 3/5   ? Baseline circular but no lift, 25   ? Time 4   ? Period Weeks   ? Status On-going   ? Target Date 05/21/21    ? ?  ?  ? ?  ? ? ? ? PT Long Term Goals - 06/18/21 1050   ? ?  ? PT LONG TERM GOAL #1  ? Title independent with advanced HEP for pelvic floor and core strength   ? Time 12   ? Period Weeks   ? Status On-going   ?  ? PT LONG TERM GOAL #2  ? Title pelvic floor strength >/= 3/5 holding for 10 seconds so her urinary leakage decreased >/= 80%   ? Time 12   ? Period Weeks   ? Status On-going   ?  ? PT LONG TERM GOAL #3  ? Title able to quickly contract the pelvic floor to reduce leakage with coughing, laughing, and sneezing   ? Baseline no leakage with sneezing, coughing, laughing   ? Time 12   ? Period Weeks   ? Status Achieved   ?  ? PT LONG TERM GOAL #4  ? Title patient is wearing a pad for just in case not due to urinary leakage due to improved pelvic floor coordination and strength   ? Time 12   ? Period Weeks   ? Status Achieved   ? Target Date 07/16/21   ? ?  ?  ? ?  ? ? ? ? ? ? ? ? Plan - 06/18/21 1055   ? ? Clinical Impression Statement Patient was not able to do her exercises this week due to being busy so she has not noticed changes at this time. Patient was able to engage her abdominals and pelvic floor with her exercises. Patient can feel the pelvic floor working. Patient pelvic floor strength will not be assessed due to her not comfortable with internal assessment so strength will be assessed with improvement of her symptoms. Patient will benefit from skilled therapy to improve pelvic floor contraction and coordination to reduce leakage.   ? Personal Factors and Comorbidities Fitness;Age;Past/Current Experience;Comorbidity 3+   ? Comorbidities Diabetes; h/o Melanoma on her back; osteopenia; Abdominal Hystorectomy 07/1998   ? Examination-Activity Limitations Continence   ? Stability/Clinical Decision Making Evolving/Moderate complexity   ? Rehab Potential Excellent   ? PT Frequency 1x / week   ? PT Duration 12 weeks   ? PT Treatment/Interventions ADLs/Self Care Home Management;Biofeedback;Therapeutic  activities;Therapeutic exercise;Neuromuscular re-education;Patient/family education;Manual techniques;Dry needling   ?  PT Next Visit Plan continues with nustep, working on advanced supine core strength, add one legged activity; see how the leakage is going   ? PT Home Exercise Plan Access Code: [email or web address]   ? Consulted and Agree with Plan of Care Patient   ? ?  ?  ? ?  ? ? ?Patient will benefit from skilled therapeutic intervention in order to improve the following deficits and impairments:  Decreased coordination, Decreased range of motion, Increased fascial restricitons, Decreased endurance, Decreased activity tolerance, Decreased strength ? ?Visit Diagnosis: ?Muscle weakness (generalized) ? ?Unspecified lack of coordination ? ? ? ? ?Problem List ?Patient Active Problem List  ? Diagnosis Date Noted  ? Allergic rhinitis with a non-allergic component 11/26/2016  ? Allergic conjunctivitis 11/26/2016  ? Chronic cholecystitis with calculus 02/02/2015  ? Diabetes mellitus without complication ([place])   ? GERD (gastroesophageal reflux disease)   ? ? ?Earlie Counts, PT ?06/18/21 10:59 AM ? ?[facility] ?[facility] ?[place], [place], [address] ?Phone: [phone]   Fax:  [phone] ? ?Name: Eulogia Dismore ?MRN: [id number] ?Date of Birth: 04/10/1945 ? ? ? ?

## 2021-06-25 ENCOUNTER — Ambulatory Visit: Payer: Medicare Other | Admitting: Physical Therapy

## 2021-06-25 ENCOUNTER — Encounter: Payer: Self-pay | Admitting: Physical Therapy

## 2021-06-25 ENCOUNTER — Other Ambulatory Visit: Payer: Self-pay

## 2021-06-25 VITALS — BP 115/65 | HR 66

## 2021-06-25 DIAGNOSIS — M6281 Muscle weakness (generalized): Secondary | ICD-10-CM | POA: Diagnosis not present

## 2021-06-25 DIAGNOSIS — R279 Unspecified lack of coordination: Secondary | ICD-10-CM | POA: Diagnosis not present

## 2021-06-25 NOTE — Therapy (Signed)
[facility] ?[facility] ?[place], [place], [address] ?Phone: [phone]   Fax:  [phone] ? ?Physical Therapy Treatment ? ?Patient Details  ?Name: Margaret Bond ?MRN: [id number] ?Date of Birth: 04-13-1945 ?Referring Provider (PT): Dr. Cari Caraway ? ? ?Encounter Date: 06/25/2021 ? ? PT End of Session - 06/25/21 1018   ? ? Visit Number 8   ? Date for PT Re-Evaluation 07/16/21   ? Authorization Type UHC medicare   ? Authorization - Visit Number 8   ? Authorization - Number of Visits 10   ? PT Start Time 1015   ? PT Stop Time 1055   ? PT Time Calculation (min) 40 min   ? Activity Tolerance Patient tolerated treatment well   ? Behavior During Therapy [facility] for tasks assessed/performed   ? ?  ?  ? ?  ? ? ?Past Medical History:  ?Diagnosis Date  ? Complication of anesthesia   ? itchy and hyper after anesthesia, nausea likes phenergan  ? Diabetes mellitus without complication ([place])   ? Gallstones   ? GERD (gastroesophageal reflux disease)   ? Hypertension   ? PONV (postoperative nausea and vomiting)   ? Urticaria   ? ? ?Past Surgical History:  ?Procedure Laterality Date  ? ABDOMINAL HYSTERECTOMY    ? complete  ? colonscopy    ? ESOPHAGOGASTRODUODENOSCOPY ENDOSCOPY    ? LAPAROSCOPIC CHOLECYSTECTOMY SINGLE SITE WITH INTRAOPERATIVE CHOLANGIOGRAM N/A 02/02/2015  ? Procedure: LAPAROSCOPIC CHOLECYSTECTOMY SINGLE SITE WITH INTRAOPERATIVE CHOLANGIOGRAM;  Surgeon: Michael Boston, MD;  Location: [facility];  Service: General;  Laterality: N/A;  ? ? ?Vitals:  ? 06/25/21 1038  ?BP: 115/65  ?Pulse: 66  ? ? ? ? ? ? ? ? ? ? ? ? ? ? ? ? ? ? ? ? ? ? [facility] Adult PT Treatment/Exercise - 06/25/21 0001   ? ?  ? Self-Care  ? Self-Care Other Self-Care Comments   ? Other Self-Care Comments  discussed with patient to stop drinking water 2 hours prior to going to bed to reduce the urinary leakage at night.   ?  ? Lumbar Exercises: Aerobic  ? Nustep level 5 for 6 minutes while assessing patient    ?  ? Lumbar Exercises: Standing  ? Other Standing Lumbar Exercises standing pallof red band 10x right side and left side standing on foam and feet tandem   ? Other Standing Lumbar Exercises standing alternating shoulder extension with pelvic floor and abdominal contraction using the red band, standing on foam in tandem stance   ?  ? Lumbar Exercises: Supine  ? Dead Bug 10 reps;1 second   feet on bolster  ? Dead Bug Limitations blue loop around the knees and holding green band in hands   ? Bridge with clamshell 15 reps;1 second   blue loop around knees; feet on bolster  ? Bridge with [employer] Limitations Pelvic floor contraction; blue band around the knees   ?  ? Lumbar Exercises: Sidelying  ? Hip Abduction Right;Left;10 reps   ? Hip Abduction Weights (lbs) with abdominal contraction and pelvic floor   ?  ? Lumbar Exercises: Quadruped  ? Other Quadruped Lumbar Exercises bear plank 10x   ?  ? Knee/Hip Exercises: Standing  ? Functional Squat 20 reps   ? Functional Squat Limitations standing on flat side of the BOSU ball with pelvic floor contraction   ? Other Standing Knee Exercises standing dead lift with one leg back and verbal  cues to keep the space between the breast bone and pubic bone 10x each side   ? ?  ?  ? ?  ? ? ? ? ? ? ? ? ? ? PT Education - 06/25/21 1057   ? ? Education Details dicussed with patient on stopping drinking water 2 hours prior to going to bed   ? Person(s) Educated Patient   ? Methods Explanation   ? Comprehension Verbalized understanding   ? ?  ?  ? ?  ? ? ? PT Short Term Goals - 06/13/21 1020   ? ?  ? PT SHORT TERM GOAL #1  ? Title independent with initial HEP for pelvic floor bulging and diaphragmatic breathing   ? Time 4   ? Period Weeks   ? Status Achieved   ? Target Date 05/21/21   ?  ? PT SHORT TERM GOAL #2  ? Title able to breath with full excursion of his lower rib cage with reducing her rib angle   ? Time 4   ? Period Weeks   ? Status Achieved   ? Target Date 05/21/21   ?  ? PT  SHORT TERM GOAL #3  ? Title able to form a circular contraction with strength of pelvic floor >/= 3/5   ? Baseline circular but no lift, 25   ? Time 4   ? Period Weeks   ? Status On-going   ? Target Date 05/21/21   ? ?  ?  ? ?  ? ? ? ? PT Long Term Goals - 06/25/21 1100   ? ?  ? PT LONG TERM GOAL #1  ? Title independent with advanced HEP for pelvic floor and core strength   ? Time 12   ? Period Weeks   ? Status On-going   ? Target Date 07/16/21   ?  ? PT LONG TERM GOAL #2  ? Title pelvic floor strength >/= 3/5 holding for 10 seconds so her urinary leakage decreased >/= 80%   ? Baseline 60% better; leaks while sleeping but not during the day   ? Time 12   ? Period Weeks   ? Status On-going   ? Target Date 07/16/21   ?  ? PT LONG TERM GOAL #3  ? Title able to quickly contract the pelvic floor to reduce leakage with coughing, laughing, and sneezing   ? Baseline no leakage with sneezing, coughing, laughing   ? Time 12   ? Period Weeks   ? Status Achieved   ?  ? PT LONG TERM GOAL #4  ? Title patient is wearing a pad for just in case not due to urinary leakage due to improved pelvic floor coordination and strength   ? Time 12   ? Period Weeks   ? Status Achieved   ? ?  ?  ? ?  ? ? ? ? ? ? ? ? Plan - 06/25/21 1019   ? ? Clinical Impression Statement Patient is not leaking urine during the day just at night when she is sleeping. Patient is working on core and pelvic floor strength in standing while challenging her balance. Patient is able to engage her lower abdominals and pelvic floor better during exercise. Patient will benefit from skilled therapy to improve pelvic floor contraction and coordination to reduce leakage.   ? Personal Factors and Comorbidities Fitness;Age;Past/Current Experience;Comorbidity 3+   ? Comorbidities Diabetes; h/o Melanoma on her back; osteopenia; Abdominal Hystorectomy 07/1998   ? Examination-Activity  Limitations Continence   ? Stability/Clinical Decision Making Evolving/Moderate complexity   ?  Rehab Potential Excellent   ? PT Frequency 1x / week   ? PT Duration 12 weeks   ? PT Treatment/Interventions ADLs/Self Care Home Management;Biofeedback;Therapeutic activities;Therapeutic exercise;Neuromuscular re-education;Patient/family education;Manual techniques;Dry needling   ? PT Next Visit Plan continues with nustep, working on advanced supine core strength, add one legged activity; see how the leakage is going at night with  stopping drinking water at night   ? PT Home Exercise Plan Access Code: [email or web address]   ? Recommended Other Services MD signed initial eval   ? Consulted and Agree with Plan of Care Patient   ? ?  ?  ? ?  ? ? ?Patient will benefit from skilled therapeutic intervention in order to improve the following deficits and impairments:  Decreased coordination, Decreased range of motion, Increased fascial restricitons, Decreased endurance, Decreased activity tolerance, Decreased strength ? ?Visit Diagnosis: ?Muscle weakness (generalized) ? ?Unspecified lack of coordination ? ? ? ? ?Problem List ?Patient Active Problem List  ? Diagnosis Date Noted  ? Allergic rhinitis with a non-allergic component 11/26/2016  ? Allergic conjunctivitis 11/26/2016  ? Chronic cholecystitis with calculus 02/02/2015  ? Diabetes mellitus without complication ([place])   ? GERD (gastroesophageal reflux disease)   ? ? ?Earlie Counts, PT ?06/25/21 11:02 AM ? ? ?[facility] ?[facility] [place] ?[place], [place], [address] ?Phone: [phone]   Fax:  [phone] ? ?Name: Margaret Bond ?MRN: [id number] ?Date of Birth: 12/22/1945 ? ? ? ?

## 2021-07-02 ENCOUNTER — Encounter: Payer: Self-pay | Admitting: Physical Therapy

## 2021-07-02 ENCOUNTER — Ambulatory Visit: Payer: Medicare Other | Attending: Family Medicine | Admitting: Physical Therapy

## 2021-07-02 DIAGNOSIS — M6281 Muscle weakness (generalized): Secondary | ICD-10-CM | POA: Insufficient documentation

## 2021-07-02 DIAGNOSIS — R279 Unspecified lack of coordination: Secondary | ICD-10-CM | POA: Diagnosis not present

## 2021-07-02 NOTE — Therapy (Signed)
[facility] ?[facility] [place] ?[place], [place], [address] ?Phone: [phone]   Fax:  [phone] ? ?Physical Therapy Treatment ? ?Patient Details  ?Name: Margaret Bond ?MRN: [id number] ?Date of Birth: [birth year] ?Referring Provider (PT): Dr. Cari Caraway ? ? ?Encounter Date: 07/02/2021 ? ? PT End of Session - 07/02/21 1017   ? ? Visit Number 9   ? Date for PT Re-Evaluation 07/16/21   ? Authorization Type UHC medicare   ? Authorization - Visit Number 9   ? Authorization - Number of Visits 10   ? PT Start Time 1015   ? PT Stop Time 1055   ? PT Time Calculation (min) 40 min   ? Activity Tolerance Patient tolerated treatment well   ? Behavior During Therapy [facility] for tasks assessed/performed   ? ?  ?  ? ?  ? ? ?Past Medical History:  ?Diagnosis Date  ? Complication of anesthesia   ? itchy and hyper after anesthesia, nausea likes phenergan  ? Diabetes mellitus without complication ([place])   ? Gallstones   ? GERD (gastroesophageal reflux disease)   ? Hypertension   ? PONV (postoperative nausea and vomiting)   ? Urticaria   ? ? ?Past Surgical History:  ?Procedure Laterality Date  ? ABDOMINAL HYSTERECTOMY    ? complete  ? colonscopy    ? ESOPHAGOGASTRODUODENOSCOPY ENDOSCOPY    ? LAPAROSCOPIC CHOLECYSTECTOMY SINGLE SITE WITH INTRAOPERATIVE CHOLANGIOGRAM N/A 02/02/2015  ? Procedure: LAPAROSCOPIC CHOLECYSTECTOMY SINGLE SITE WITH INTRAOPERATIVE CHOLANGIOGRAM;  Surgeon: Michael Boston, MD;  [facility];  Service: General;  Laterality: N/A;  ? ? ?There were no vitals filed for this visit. ? ? Subjective Assessment - 07/02/21 1019   ? ? Subjective I will go to the bathroom when I do not sleep well. I am 60% better from eval.   ? Patient Stated Goals reduce her leakage.   ? Currently in Pain? No/denies   ? ?  ?  ? ?  ? ? ? ? ? ? ? ? ? ? ? ? ? ? ? ? ? ? ? ? [facility] Adult PT Treatment/Exercise - 07/02/21 0001   ? ?  ? Lumbar Exercises: Aerobic  ? Nustep level 5 for 6 minutes while  assessing patient   ?  ? Lumbar Exercises: Supine  ? Bridge 10 reps   ? Bridge Limitations feet on bolster, with ball squeeze   ? Other Supine Lumbar Exercises bridge with feet on bolster and ball squeeze  and move arms up and down   ?  ? Knee/Hip Exercises: Standing  ? Lateral Step Up Right;Left;1 set;15 reps;Step Height: 6"   ? Lateral Step Up Limitations going up and over step with pelvic floor contraction   ? Functional Squat 20 reps   ? Functional Squat Limitations standing on flat side of the BOSU ball with pelvic floor contraction   ? Other Standing Knee Exercises tandem stance holding onto 5# weight moving up and down then side to side with pelvic floor contraction   ? Other Standing Knee Exercises standing holding onto the red band forward and each side as she marches; walking over bars sideways without weight then forward holding onto 5# with pelvic floor contractions   ? ?  ?  ? ?  ? ? ? ? ? ? ? ? ? ? ? ? PT Short Term Goals - 07/02/21 1101   ? ?  ? PT SHORT TERM GOAL #3  ? Title  able to form a circular contraction with strength of pelvic floor >/= 3/5   ? Time 4   ? Period Weeks   ? Status Deferred   does not want internal to check strength  ? ?  ?  ? ?  ? ? ? ? PT Long Term Goals - 07/02/21 1101   ? ?  ? PT LONG TERM GOAL #1  ? Title independent with advanced HEP for pelvic floor and core strength   ? Time 12   ? Period Weeks   ? Status On-going   ?  ? PT LONG TERM GOAL #2  ? Title pelvic floor strength >/= 3/5 holding for 10 seconds so her urinary leakage decreased >/= 80%   ? Baseline 60% better; leaks while sleeping but not during the day   ? Time 12   ? Period Weeks   ? Status On-going   ? Target Date 07/16/21   ?  ? PT LONG TERM GOAL #3  ? Title able to quickly contract the pelvic floor to reduce leakage with coughing, laughing, and sneezing   ? Period Weeks   ? Status Achieved   ?  ? PT LONG TERM GOAL #4  ? Title patient is wearing a pad for just in case not due to urinary leakage due to improved  pelvic floor coordination and strength   ? Time 12   ? Period Weeks   ? Status Achieved   ? ?  ?  ? ?  ? ? ? ? ? ? ? ? Plan - 07/02/21 1058   ? ? Clinical Impression Statement Patient reports she is 60% better since the evaluation. She is able to control her leakage better. Patient is going to the bathroom more when she does not sleep well due to feeling like she should go to the bathroom since she is awake. Patient exercises are working in standing and challenging her core and balance to work on the pelvic floor. Patient does not need as many tactile cues for the pelvic floor and core contract. Patient will benefit from skilled therapy to improve pelvic floor contraction and coordination to reduce leakage.   ? Personal Factors and Comorbidities Fitness;Age;Past/Current Experience;Comorbidity 3+   ? Comorbidities Diabetes; h/o Melanoma on her back; osteopenia; Abdominal Hystorectomy 07/1998   ? Examination-Activity Limitations Continence   ? Stability/Clinical Decision Making Evolving/Moderate complexity   ? Rehab Potential Excellent   ? PT Frequency 1x / week   ? PT Duration 12 weeks   ? PT Next Visit Plan continues with nustep, working on advanced supine core strength, add one legged activity and update HEP   ? PT Home Exercise Plan Access Code: [email or web address]   ? Consulted and Agree with Plan of Care Patient   ? ?  ?  ? ?  ? ? ?Patient will benefit from skilled therapeutic intervention in order to improve the following deficits and impairments:  Decreased coordination, Decreased range of motion, Increased fascial restricitons, Decreased endurance, Decreased activity tolerance, Decreased strength ? ?Visit Diagnosis: ?Muscle weakness (generalized) ? ?Unspecified lack of coordination ? ? ? ? ?Problem List ?Patient Active Problem List  ? Diagnosis Date Noted  ? Allergic rhinitis with a non-allergic component 11/26/2016  ? Allergic conjunctivitis 11/26/2016  ? Chronic cholecystitis with calculus 02/02/2015  ? Diabetes  mellitus without complication ([place])   ? GERD (gastroesophageal reflux disease)   ? ? ?Earlie Counts, PT ?07/02/21 11:02 AM ? ?[facility] ?[facility]  [place] ?[place], [place], [address] ?Phone: [phone]   Fax:  [phone] ? ?Name: Margaret Bond ?MRN: [id number] ?Date of Birth: 11/18/1945 ? ? ? ?

## 2021-07-08 DIAGNOSIS — E1165 Type 2 diabetes mellitus with hyperglycemia: Secondary | ICD-10-CM | POA: Diagnosis not present

## 2021-07-09 ENCOUNTER — Ambulatory Visit: Payer: Medicare Other | Admitting: Physical Therapy

## 2021-07-09 ENCOUNTER — Encounter: Payer: Self-pay | Admitting: Physical Therapy

## 2021-07-09 DIAGNOSIS — M6281 Muscle weakness (generalized): Secondary | ICD-10-CM | POA: Diagnosis not present

## 2021-07-09 DIAGNOSIS — R279 Unspecified lack of coordination: Secondary | ICD-10-CM | POA: Diagnosis not present

## 2021-07-09 NOTE — Patient Instructions (Signed)
Access Code: [email or web address] ?URL: [email or web address] ?Date: 07/09/2021 ?Prepared by: Earlie Counts ? ?Exercises ?- Supine Bridge with Mini Swiss Ball Between Knees  - 1 x daily - 3 x weekly - 1 sets - 10 reps ?- Supine March with Resistance Band  - 1 x daily - 3 x weekly - 3 sets - 10 reps ?- Hooklying Clamshell with Resistance  - 1 x daily - 3 x weekly - 2 sets - 10 reps ?- Seated Pelvic Floor Contraction  - 2 x daily - 7 x weekly - 1 sets - 10 reps - 10 sec hold ?- Seated Quick Flick Pelvic Floor Contractions  - 2 x daily - 7 x weekly - 1 sets - 5 reps ?- Standing Anti-Rotation Press with Anchored Resistance  - 1 x daily - 3 x weekly - 2 sets - 10 reps ?- Standing Alternating Shoulder Extension with Resistance  - 1 x daily - 3 x weekly - 2 sets - 10 reps ?- Doorway Pec Stretch at 90 Degrees Abduction  - 1 x daily - 3 x weekly - 1 sets - 2 reps - 30 sec hold ?- Lateral Step Ups  - 1 x daily - 3 x weekly - 2 sets - 10 reps ?- Single Leg Stance Stabilization with Quick Front to Back Band Oscillations - Anterior Anchor  - 1 x daily - 3 x weekly - 2 sets - 10 reps ?- Single Leg Stance Stabilization with Quick Side to Side Band Oscillations - Medial Anchor  - 1 x daily - 3 x weekly - 2 sets - 10 reps ?[facility] ?[address], [address] ?[place], [place] [address] ?Phone # [phone] ?Fax [phone] ? ?

## 2021-07-09 NOTE — Therapy (Signed)
[facility] ?[place], [place], [address] ?Phone: [phone]   Fax:  [phone] ? ?Physical Therapy Treatment ? ?Patient Details  ?Name: Margaret Bond ?MRN: [id number] ?Date of Birth: [birth year] ?Referring Provider (PT): Dr. Cari Caraway ?Progress Note ?Reporting Period 04/23/2021 to 07/09/2021 ? ?See note below for Objective Data and Assessment of Progress/Goals.  ? ?  ? ?Encounter Date: 07/09/2021 ? ? PT End of Session - 07/09/21 1017   ? ? Visit Number 10   ? Date for PT Re-Evaluation 07/16/21   ? Authorization Type [employer] medicare   ? Authorization - Visit Number 10   ? PT Start Time 1015   ? PT Stop Time 1055   ? PT Time Calculation (min) 40 min   ? Activity Tolerance Patient tolerated treatment well   ? Behavior During Therapy [facility] for tasks assessed/performed   ? ?  ?  ? ?  ? ? ?Past Medical History:  ?Diagnosis Date  ? Complication of anesthesia   ? itchy and hyper after anesthesia, nausea likes phenergan  ? Diabetes mellitus without complication ([place])   ? Gallstones   ? GERD (gastroesophageal reflux disease)   ? Hypertension   ? PONV (postoperative nausea and vomiting)   ? Urticaria   ? ? ?Past Surgical History:  ?Procedure Laterality Date  ? ABDOMINAL HYSTERECTOMY    ? complete  ? colonscopy    ? ESOPHAGOGASTRODUODENOSCOPY ENDOSCOPY    ? LAPAROSCOPIC CHOLECYSTECTOMY SINGLE SITE WITH INTRAOPERATIVE CHOLANGIOGRAM N/A 02/02/2015  ? Procedure: LAPAROSCOPIC CHOLECYSTECTOMY SINGLE SITE WITH INTRAOPERATIVE CHOLANGIOGRAM;  Surgeon: Michael Boston, MD;  Location: [facility];  Service: General;  Laterality: N/A;  ? ? ?There were no vitals filed for this visit. ? ? Subjective Assessment - 07/09/21 1018   ? ? Subjective I feel therapy has helped alot. I can do the exercises at home. I did not get up at all last night.   ? Patient Stated Goals reduce her leakage.   ? Currently in Pain? No/denies   ? Multiple Pain Sites No   ? ?  ?  ? ?  ? ? ? ? ? [facility] PT  Assessment - 07/09/21 0001   ? ?  ? Assessment  ? Medical Diagnosis R32 Urinary incontinence in female   ? Referring Provider (PT) Dr. Cari Caraway   ? Onset Date/Surgical Date --   07/1998  ? Prior Therapy none   ?  ? Precautions  ? Precautions None   ?  ? Restrictions  ? Weight Bearing Restrictions No   ?  ? Home Environment  ? Living Environment Private residence   ?  ? Prior Function  ? Level of Independence Independent   ? Vocation Retired   ? Leisure walk daily, 40 minutes daily   ?  ? Cognition  ? Overall Cognitive Status Within Functional Limits for tasks assessed   ?  ? AROM  ? Overall AROM Comments lumbar ROM decreased by 25%   ?  ? Strength  ? Left Hip Extension 4+/5   ?  ? Palpation  ? Palpation comment improved lower rib cage motility   ? ?  ?  ? ?  ? ? ? ? ? ? ? ? ? ? ? ? ? Pelvic Floor Special Questions - 07/09/21 0001   ? ? Pad use 1 mini pad   ? Other activities that cause leaking just during the night and improve by 80%   ? Exam  Type Deferred   ? ?  ?  ? ?  ? ? ? ? [facility] Adult PT Treatment/Exercise - 07/09/21 0001   ? ?  ? Lumbar Exercises: Aerobic  ? Nustep level 5 for 6 minutes while assessing patient   ?  ? Lumbar Exercises: Standing  ? Other Standing Lumbar Exercises standing alternate shoulder extension on foam with pelvic floor contraction 20x using green band   ? Other Standing Lumbar Exercises pectoralis stretch holding 15 sec 2x in doorway   ?  ? Lumbar Exercises: Supine  ? Clam 10 reps;1 second   ? Clam Limitations with green loop around the knees with pelvic floor contraction   ? Bridge 10 reps   ?  ? Knee/Hip Exercises: Standing  ? Functional Squat 20 reps   ? Functional Squat Limitations standing on flat side of the BOSU ball with pelvic floor contraction   ? SLS pulling on green band 20x on right and left with pelvic floor contraction   ? SLS with Vectors single leg stance pulling to the side on one leg 20 x each way wiht pelvic floor contraction   ? Other Standing Knee Exercises  tandem stance on foam roll with Pallof green band and pelvic floor contraction   ? ?  ?  ? ?  ? ? ? ? ? ? ? ? ? ? PT Education - 07/09/21 1059   ? ? Education Details Access Code: [email or web address]   ? Person(s) Educated Patient   ? Methods Explanation;Demonstration;Handout   ? Comprehension Returned demonstration;Verbalized understanding   ? ?  ?  ? ?  ? ? ? PT Short Term Goals - 07/09/21 1019   ? ?  ? PT SHORT TERM GOAL #1  ? Title independent with initial HEP for pelvic floor bulging and diaphragmatic breathing   ? Time 4   ? Period Weeks   ? Status Achieved   ? Target Date 05/21/21   ?  ? PT SHORT TERM GOAL #2  ? Title able to breath with full excursion of his lower rib cage with reducing her rib angle   ? Time 4   ? Period Weeks   ? Status Achieved   ? Target Date 05/21/21   ?  ? PT SHORT TERM GOAL #3  ? Title able to form a circular contraction with strength of pelvic floor >/= 3/5   ? Time 4   ? Period Weeks   ? Status Deferred   does no want internal work  ? Target Date 05/21/21   ? ?  ?  ? ?  ? ? ? ? PT Long Term Goals - 07/09/21 1019   ? ?  ? PT LONG TERM GOAL #1  ? Title independent with advanced HEP for pelvic floor and core strength   ? Time 12   ? Period Weeks   ? Status Achieved   ? Target Date 07/16/21   ?  ? PT LONG TERM GOAL #2  ? Title pelvic floor strength >/= 3/5 holding for 10 seconds so her urinary leakage decreased >/= 80%   ? Baseline 80% better; leaks while sleeping but not during the day   ? Time 12   ? Period Weeks   ? Status Achieved   ? Target Date 07/16/21   ?  ? PT LONG TERM GOAL #3  ? Title able to quickly contract the pelvic floor to reduce leakage with coughing, laughing, and sneezing   ? Time 12   ?  Period Weeks   ? Status Achieved   ? Target Date 07/16/21   ?  ? PT LONG TERM GOAL #4  ? Title patient is wearing a pad for just in case not due to urinary leakage due to improved pelvic floor coordination and strength   ? Time 12   ? Period Weeks   ? Status Achieved   ? Target Date 07/16/21    ? ?  ?  ? ?  ? ? ? ? ? ? ? ? Plan - 07/09/21 1050   ? ? Clinical Impression Statement Patient reports her leakage is 80% better. Patient is not leaking with coughing, sneezing and leaking. Patient did not wake up last night to go to the bathroom or had any leakage. Patient is wearing a thing pad per day. Patient is independent with her HEP and consistent. Patient has increased strength of hips. Patient is ready for discharge.   ? Personal Factors and Comorbidities Fitness;Age;Past/Current Experience;Comorbidity 3+   ? Comorbidities Diabetes; h/o Melanoma on her back; osteopenia; Abdominal Hystorectomy 07/1998   ? Examination-Activity Limitations Continence   ? Stability/Clinical Decision Making Evolving/Moderate complexity   ? Rehab Potential Excellent   ? PT Treatment/Interventions ADLs/Self Care Home Management;Biofeedback;Therapeutic activities;Therapeutic exercise;Neuromuscular re-education;Patient/family education;Manual techniques;Dry needling   ? PT Next Visit Plan Discharge to HEP   ? PT Home Exercise Plan Access Code: [email or web address]   ? Consulted and Agree with Plan of Care Patient   ? ?  ?  ? ?  ? ? ?Patient will benefit from skilled therapeutic intervention in order to improve the following deficits and impairments:  Decreased coordination, Decreased range of motion, Increased fascial restricitons, Decreased endurance, Decreased activity tolerance, Decreased strength ? ?Visit Diagnosis: ?Muscle weakness (generalized) ? ?Unspecified lack of coordination ? ? ? ? ?Problem List ?Patient Active Problem List  ? Diagnosis Date Noted  ? Allergic rhinitis with a non-allergic component 11/26/2016  ? Allergic conjunctivitis 11/26/2016  ? Chronic cholecystitis with calculus 02/02/2015  ? Diabetes mellitus without complication ([place])   ? GERD (gastroesophageal reflux disease)   ? ? ?Earlie Counts, PT ?07/09/21 11:00 AM ? ? ?[facility] ?[facility] [place] ?[place], [place],  [address] ?Phone: [phone]   Fax:  [phone] ? ?Name: Phylicia Mcgaugh ?MRN: [id number] ?Date of Birth: 1945/10/01 ?PHYSICAL THERAPY DISCHARGE SUMMARY ? ?Visits from Start of Care: 10 ? ?Current functiona

## 2021-07-10 DIAGNOSIS — E1165 Type 2 diabetes mellitus with hyperglycemia: Secondary | ICD-10-CM | POA: Diagnosis not present

## 2021-07-10 DIAGNOSIS — I10 Essential (primary) hypertension: Secondary | ICD-10-CM | POA: Diagnosis not present

## 2021-07-10 DIAGNOSIS — N189 Chronic kidney disease, unspecified: Secondary | ICD-10-CM | POA: Diagnosis not present

## 2021-07-10 DIAGNOSIS — E7800 Pure hypercholesterolemia, unspecified: Secondary | ICD-10-CM | POA: Diagnosis not present

## 2021-07-16 ENCOUNTER — Encounter: Payer: Medicare Other | Admitting: Physical Therapy

## 2021-07-24 DIAGNOSIS — L820 Inflamed seborrheic keratosis: Secondary | ICD-10-CM | POA: Diagnosis not present

## 2021-07-24 DIAGNOSIS — D1801 Hemangioma of skin and subcutaneous tissue: Secondary | ICD-10-CM | POA: Diagnosis not present

## 2021-07-24 DIAGNOSIS — D225 Melanocytic nevi of trunk: Secondary | ICD-10-CM | POA: Diagnosis not present

## 2021-08-07 DIAGNOSIS — E1165 Type 2 diabetes mellitus with hyperglycemia: Secondary | ICD-10-CM | POA: Diagnosis not present

## 2021-09-07 DIAGNOSIS — E1165 Type 2 diabetes mellitus with hyperglycemia: Secondary | ICD-10-CM | POA: Diagnosis not present

## 2021-10-07 DIAGNOSIS — E1165 Type 2 diabetes mellitus with hyperglycemia: Secondary | ICD-10-CM | POA: Diagnosis not present

## 2021-11-07 DIAGNOSIS — E1165 Type 2 diabetes mellitus with hyperglycemia: Secondary | ICD-10-CM | POA: Diagnosis not present

## 2021-11-20 ENCOUNTER — Other Ambulatory Visit: Payer: Self-pay | Admitting: Family Medicine

## 2021-11-20 DIAGNOSIS — Z1231 Encounter for screening mammogram for malignant neoplasm of breast: Secondary | ICD-10-CM

## 2021-11-29 DIAGNOSIS — N1832 Chronic kidney disease, stage 3b: Secondary | ICD-10-CM | POA: Diagnosis not present

## 2021-11-29 DIAGNOSIS — E785 Hyperlipidemia, unspecified: Secondary | ICD-10-CM | POA: Diagnosis not present

## 2021-11-29 DIAGNOSIS — I129 Hypertensive chronic kidney disease with stage 1 through stage 4 chronic kidney disease, or unspecified chronic kidney disease: Secondary | ICD-10-CM | POA: Diagnosis not present

## 2021-11-29 DIAGNOSIS — N390 Urinary tract infection, site not specified: Secondary | ICD-10-CM | POA: Diagnosis not present

## 2021-11-29 DIAGNOSIS — N281 Cyst of kidney, acquired: Secondary | ICD-10-CM | POA: Diagnosis not present

## 2021-11-29 DIAGNOSIS — E1122 Type 2 diabetes mellitus with diabetic chronic kidney disease: Secondary | ICD-10-CM | POA: Diagnosis not present

## 2021-12-07 ENCOUNTER — Other Ambulatory Visit: Payer: Self-pay | Admitting: Nephrology

## 2021-12-07 DIAGNOSIS — N281 Cyst of kidney, acquired: Secondary | ICD-10-CM

## 2021-12-08 DIAGNOSIS — E1165 Type 2 diabetes mellitus with hyperglycemia: Secondary | ICD-10-CM | POA: Diagnosis not present

## 2021-12-24 ENCOUNTER — Ambulatory Visit: Payer: Medicare Other

## 2021-12-25 ENCOUNTER — Ambulatory Visit
Admission: RE | Admit: 2021-12-25 | Discharge: 2021-12-25 | Disposition: A | Discharge status: Home / Self Care | Payer: Medicare Other | Source: Ambulatory Visit | Attending: Nephrology | Admitting: Nephrology

## 2021-12-25 DIAGNOSIS — N281 Cyst of kidney, acquired: Secondary | ICD-10-CM | POA: Diagnosis not present

## 2021-12-25 DIAGNOSIS — C801 Malignant (primary) neoplasm, unspecified: Secondary | ICD-10-CM | POA: Diagnosis not present

## 2021-12-25 DIAGNOSIS — N2889 Other specified disorders of kidney and ureter: Secondary | ICD-10-CM | POA: Diagnosis not present

## 2021-12-25 DIAGNOSIS — K5730 Diverticulosis of large intestine without perforation or abscess without bleeding: Secondary | ICD-10-CM | POA: Diagnosis not present

## 2021-12-25 MED ORDER — GADOBENATE DIMEGLUMINE 529 MG/ML IV SOLN
15.0000 mL | Freq: Once | INTRAVENOUS | Status: AC | PRN
  Administered 2021-12-25: 15 mL via INTRAVENOUS

## 2022-01-02 DIAGNOSIS — E119 Type 2 diabetes mellitus without complications: Secondary | ICD-10-CM | POA: Diagnosis not present

## 2022-01-02 DIAGNOSIS — Z961 Presence of intraocular lens: Secondary | ICD-10-CM | POA: Diagnosis not present

## 2022-01-02 DIAGNOSIS — H524 Presbyopia: Secondary | ICD-10-CM | POA: Diagnosis not present

## 2022-01-07 DIAGNOSIS — E1165 Type 2 diabetes mellitus with hyperglycemia: Secondary | ICD-10-CM | POA: Diagnosis not present

## 2022-01-09 DIAGNOSIS — E1165 Type 2 diabetes mellitus with hyperglycemia: Secondary | ICD-10-CM | POA: Diagnosis not present

## 2022-01-09 DIAGNOSIS — E7800 Pure hypercholesterolemia, unspecified: Secondary | ICD-10-CM | POA: Diagnosis not present

## 2022-01-16 DIAGNOSIS — E1165 Type 2 diabetes mellitus with hyperglycemia: Secondary | ICD-10-CM | POA: Diagnosis not present

## 2022-01-16 DIAGNOSIS — N189 Chronic kidney disease, unspecified: Secondary | ICD-10-CM | POA: Diagnosis not present

## 2022-01-16 DIAGNOSIS — M791 Myalgia, unspecified site: Secondary | ICD-10-CM | POA: Diagnosis not present

## 2022-01-16 DIAGNOSIS — I10 Essential (primary) hypertension: Secondary | ICD-10-CM | POA: Diagnosis not present

## 2022-01-16 DIAGNOSIS — E559 Vitamin D deficiency, unspecified: Secondary | ICD-10-CM | POA: Diagnosis not present

## 2022-01-16 DIAGNOSIS — E7800 Pure hypercholesterolemia, unspecified: Secondary | ICD-10-CM | POA: Diagnosis not present

## 2022-01-17 DIAGNOSIS — M791 Myalgia, unspecified site: Secondary | ICD-10-CM | POA: Diagnosis not present

## 2022-01-18 ENCOUNTER — Ambulatory Visit: Payer: Medicare Other

## 2022-02-07 DIAGNOSIS — E1165 Type 2 diabetes mellitus with hyperglycemia: Secondary | ICD-10-CM | POA: Diagnosis not present

## 2022-02-20 DIAGNOSIS — L570 Actinic keratosis: Secondary | ICD-10-CM | POA: Diagnosis not present

## 2022-02-20 DIAGNOSIS — C44519 Basal cell carcinoma of skin of other part of trunk: Secondary | ICD-10-CM | POA: Diagnosis not present

## 2022-02-20 DIAGNOSIS — Z85828 Personal history of other malignant neoplasm of skin: Secondary | ICD-10-CM | POA: Diagnosis not present

## 2022-02-20 DIAGNOSIS — D2361 Other benign neoplasm of skin of right upper limb, including shoulder: Secondary | ICD-10-CM | POA: Diagnosis not present

## 2022-02-20 DIAGNOSIS — L814 Other melanin hyperpigmentation: Secondary | ICD-10-CM | POA: Diagnosis not present

## 2022-02-20 DIAGNOSIS — L821 Other seborrheic keratosis: Secondary | ICD-10-CM | POA: Diagnosis not present

## 2022-02-27 DIAGNOSIS — J0110 Acute frontal sinusitis, unspecified: Secondary | ICD-10-CM | POA: Diagnosis not present

## 2022-02-27 DIAGNOSIS — R051 Acute cough: Secondary | ICD-10-CM | POA: Diagnosis not present

## 2022-03-01 ENCOUNTER — Ambulatory Visit
Admission: RE | Admit: 2022-03-01 | Discharge: 2022-03-01 | Disposition: A | Discharge status: Home / Self Care | Payer: Medicare Other | Source: Ambulatory Visit | Attending: Family Medicine | Admitting: Family Medicine

## 2022-03-01 DIAGNOSIS — Z1231 Encounter for screening mammogram for malignant neoplasm of breast: Secondary | ICD-10-CM | POA: Diagnosis not present

## 2022-03-09 DIAGNOSIS — E1165 Type 2 diabetes mellitus with hyperglycemia: Secondary | ICD-10-CM | POA: Diagnosis not present

## 2022-09-03 ENCOUNTER — Other Ambulatory Visit: Payer: Self-pay | Admitting: Otolaryngology

## 2022-09-03 DIAGNOSIS — H90A22 Sensorineural hearing loss, unilateral, left ear, with restricted hearing on the contralateral side: Secondary | ICD-10-CM

## 2022-09-19 ENCOUNTER — Ambulatory Visit
Admission: RE | Admit: 2022-09-19 | Discharge: 2022-09-19 | Disposition: A | Discharge status: Home / Self Care | Payer: Medicare Other | Source: Ambulatory Visit | Attending: Otolaryngology | Admitting: Otolaryngology

## 2022-09-19 DIAGNOSIS — H90A22 Sensorineural hearing loss, unilateral, left ear, with restricted hearing on the contralateral side: Secondary | ICD-10-CM

## 2022-09-19 MED ORDER — GADOPICLENOL 0.5 MMOL/ML IV SOLN
6.0000 mL | Freq: Once | INTRAVENOUS | Status: AC | PRN
  Administered 2022-09-19: 6 mL via INTRAVENOUS

## 2023-01-16 ENCOUNTER — Other Ambulatory Visit: Payer: Self-pay | Admitting: Family Medicine

## 2023-01-16 DIAGNOSIS — Z1231 Encounter for screening mammogram for malignant neoplasm of breast: Secondary | ICD-10-CM

## 2023-03-04 ENCOUNTER — Ambulatory Visit: Payer: Medicare Other

## 2023-03-11 ENCOUNTER — Ambulatory Visit
Admission: RE | Admit: 2023-03-11 | Discharge: 2023-03-11 | Disposition: A | Discharge status: Home / Self Care | Payer: Medicare Other | Source: Ambulatory Visit | Attending: Family Medicine | Admitting: Family Medicine

## 2023-03-11 DIAGNOSIS — Z1231 Encounter for screening mammogram for malignant neoplasm of breast: Secondary | ICD-10-CM

## 2023-04-30 ENCOUNTER — Other Ambulatory Visit: Payer: Self-pay | Admitting: Family Medicine

## 2023-04-30 DIAGNOSIS — E2839 Other primary ovarian failure: Secondary | ICD-10-CM

## 2023-05-01 ENCOUNTER — Other Ambulatory Visit (HOSPITAL_BASED_OUTPATIENT_CLINIC_OR_DEPARTMENT_OTHER): Payer: Self-pay | Admitting: Family Medicine

## 2023-05-01 DIAGNOSIS — E7800 Pure hypercholesterolemia, unspecified: Secondary | ICD-10-CM

## 2023-06-12 ENCOUNTER — Ambulatory Visit (HOSPITAL_BASED_OUTPATIENT_CLINIC_OR_DEPARTMENT_OTHER)
Admission: RE | Admit: 2023-06-12 | Discharge: 2023-06-12 | Disposition: A | Discharge status: Home / Self Care | Payer: Self-pay | Source: Ambulatory Visit | Attending: Family Medicine | Admitting: Family Medicine

## 2023-06-12 DIAGNOSIS — E7800 Pure hypercholesterolemia, unspecified: Secondary | ICD-10-CM | POA: Insufficient documentation

## 2023-06-24 ENCOUNTER — Ambulatory Visit
Admission: RE | Admit: 2023-06-24 | Discharge: 2023-06-24 | Disposition: A | Discharge status: Home / Self Care | Source: Ambulatory Visit | Attending: Family Medicine | Admitting: Family Medicine

## 2023-06-24 DIAGNOSIS — E2839 Other primary ovarian failure: Secondary | ICD-10-CM

## 2023-12-30 ENCOUNTER — Other Ambulatory Visit: Payer: Self-pay | Admitting: Nephrology

## 2023-12-30 DIAGNOSIS — N281 Cyst of kidney, acquired: Secondary | ICD-10-CM

## 2024-01-26 ENCOUNTER — Other Ambulatory Visit: Payer: Self-pay | Admitting: Family Medicine

## 2024-01-26 DIAGNOSIS — Z1231 Encounter for screening mammogram for malignant neoplasm of breast: Secondary | ICD-10-CM

## 2024-01-27 ENCOUNTER — Ambulatory Visit
Admission: RE | Admit: 2024-01-27 | Discharge: 2024-01-27 | Disposition: A | Source: Ambulatory Visit | Attending: Nephrology | Admitting: Nephrology

## 2024-01-27 DIAGNOSIS — N281 Cyst of kidney, acquired: Secondary | ICD-10-CM

## 2024-01-27 MED ORDER — GADOPICLENOL 0.5 MMOL/ML IV SOLN
6.0000 mL | Freq: Once | INTRAVENOUS | Status: AC | PRN
  Administered 2024-01-27: 6 mL via INTRAVENOUS

## 2024-03-12 ENCOUNTER — Ambulatory Visit
Admission: RE | Admit: 2024-03-12 | Discharge: 2024-03-12 | Disposition: A | Source: Ambulatory Visit | Attending: Family Medicine | Admitting: Family Medicine

## 2024-03-12 DIAGNOSIS — Z1231 Encounter for screening mammogram for malignant neoplasm of breast: Secondary | ICD-10-CM
